# Patient Record
Sex: Female | Born: 1990 | Race: Black or African American | Hispanic: No | Marital: Single | Smoking: Former smoker
Health system: Southern US, Community
[De-identification: ages and names within clinical notes are randomized; demographics above are authoritative.]

## PROBLEM LIST (undated history)

## (undated) ENCOUNTER — Inpatient Hospital Stay (HOSPITAL_COMMUNITY): Payer: Self-pay

## (undated) DIAGNOSIS — A749 Chlamydial infection, unspecified: Secondary | ICD-10-CM

## (undated) DIAGNOSIS — Z8619 Personal history of other infectious and parasitic diseases: Secondary | ICD-10-CM

## (undated) DIAGNOSIS — J45909 Unspecified asthma, uncomplicated: Secondary | ICD-10-CM

## (undated) DIAGNOSIS — F419 Anxiety disorder, unspecified: Secondary | ICD-10-CM

## (undated) DIAGNOSIS — N83209 Unspecified ovarian cyst, unspecified side: Secondary | ICD-10-CM

## (undated) DIAGNOSIS — D573 Sickle-cell trait: Secondary | ICD-10-CM

## (undated) DIAGNOSIS — A609 Anogenital herpesviral infection, unspecified: Secondary | ICD-10-CM

## (undated) HISTORY — DX: Sickle-cell trait: D57.3

## (undated) HISTORY — DX: Personal history of other infectious and parasitic diseases: Z86.19

## (undated) HISTORY — PX: ORTHOPEDIC SURGERY: SHX850

## (undated) HISTORY — DX: Anogenital herpesviral infection, unspecified: A60.9

---
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
## 2008-04-03 ENCOUNTER — Emergency Department (HOSPITAL_COMMUNITY): Admission: EM | Admit: 2008-04-03 | Discharge: 2008-04-03 | Payer: Self-pay | Admitting: *Deleted

## 2008-06-25 ENCOUNTER — Emergency Department (HOSPITAL_COMMUNITY): Admission: EM | Admit: 2008-06-25 | Discharge: 2008-06-25 | Payer: Self-pay | Admitting: Emergency Medicine

## 2009-05-02 ENCOUNTER — Emergency Department (HOSPITAL_COMMUNITY): Admission: EM | Admit: 2009-05-02 | Discharge: 2009-05-02 | Payer: Self-pay | Admitting: Emergency Medicine

## 2009-06-08 ENCOUNTER — Emergency Department (HOSPITAL_COMMUNITY): Admission: EM | Admit: 2009-06-08 | Discharge: 2009-06-08 | Payer: Self-pay | Admitting: Emergency Medicine

## 2009-11-04 ENCOUNTER — Emergency Department (HOSPITAL_COMMUNITY): Admission: EM | Admit: 2009-11-04 | Discharge: 2009-11-04 | Payer: Self-pay | Admitting: Emergency Medicine

## 2009-11-25 ENCOUNTER — Emergency Department (HOSPITAL_COMMUNITY): Admission: EM | Admit: 2009-11-25 | Discharge: 2009-11-25 | Payer: Self-pay | Admitting: Emergency Medicine

## 2010-03-02 ENCOUNTER — Inpatient Hospital Stay (HOSPITAL_COMMUNITY)
Admission: AD | Admit: 2010-03-02 | Discharge: 2010-03-02 | Payer: Self-pay | Source: Home / Self Care | Attending: Obstetrics & Gynecology | Admitting: Obstetrics & Gynecology

## 2010-03-05 LAB — WET PREP, GENITAL
Clue Cells Wet Prep HPF POC: NONE SEEN
Trich, Wet Prep: NONE SEEN

## 2010-03-05 LAB — URINALYSIS, ROUTINE W REFLEX MICROSCOPIC
Protein, ur: NEGATIVE mg/dL
Urine Glucose, Fasting: NEGATIVE mg/dL
pH: 6.5 (ref 5.0–8.0)

## 2010-03-05 LAB — GLUCOSE, CAPILLARY: Glucose-Capillary: 82 mg/dL (ref 70–99)

## 2010-04-09 ENCOUNTER — Emergency Department (HOSPITAL_COMMUNITY)
Admission: EM | Admit: 2010-04-09 | Discharge: 2010-04-09 | Disposition: A | Discharge status: Home / Self Care | Payer: Self-pay | Attending: Emergency Medicine | Admitting: Emergency Medicine

## 2010-04-09 DIAGNOSIS — D573 Sickle-cell trait: Secondary | ICD-10-CM | POA: Insufficient documentation

## 2010-04-09 DIAGNOSIS — N898 Other specified noninflammatory disorders of vagina: Secondary | ICD-10-CM | POA: Insufficient documentation

## 2010-04-09 DIAGNOSIS — R109 Unspecified abdominal pain: Secondary | ICD-10-CM | POA: Insufficient documentation

## 2010-04-09 DIAGNOSIS — B3731 Acute candidiasis of vulva and vagina: Secondary | ICD-10-CM | POA: Insufficient documentation

## 2010-04-09 DIAGNOSIS — E282 Polycystic ovarian syndrome: Secondary | ICD-10-CM | POA: Insufficient documentation

## 2010-04-09 DIAGNOSIS — B373 Candidiasis of vulva and vagina: Secondary | ICD-10-CM | POA: Insufficient documentation

## 2010-04-09 LAB — URINALYSIS, ROUTINE W REFLEX MICROSCOPIC
Bilirubin Urine: NEGATIVE
Ketones, ur: NEGATIVE mg/dL
Nitrite: NEGATIVE
Protein, ur: NEGATIVE mg/dL
pH: 6.5 (ref 5.0–8.0)

## 2010-04-09 LAB — URINE MICROSCOPIC-ADD ON

## 2010-04-09 LAB — WET PREP, GENITAL: Trich, Wet Prep: NONE SEEN

## 2010-04-09 LAB — DIFFERENTIAL
Eosinophils Relative: 3 % (ref 0–5)
Lymphs Abs: 2.7 10*3/uL (ref 0.7–4.0)
Monocytes Absolute: 0.5 10*3/uL (ref 0.1–1.0)
Neutrophils Relative %: 57 % (ref 43–77)

## 2010-04-09 LAB — POCT PREGNANCY, URINE: Preg Test, Ur: NEGATIVE

## 2010-04-09 LAB — CBC
MCH: 31 pg (ref 26.0–34.0)
RBC: 4.74 MIL/uL (ref 3.87–5.11)
WBC: 8.1 10*3/uL (ref 4.0–10.5)

## 2010-04-10 LAB — GC/CHLAMYDIA PROBE AMP, GENITAL
Chlamydia, DNA Probe: NEGATIVE
GC Probe Amp, Genital: NEGATIVE

## 2010-04-26 LAB — COMPREHENSIVE METABOLIC PANEL
AST: 25 U/L (ref 0–37)
CO2: 27 mEq/L (ref 19–32)
Calcium: 9 mg/dL (ref 8.4–10.5)
Creatinine, Ser: 1.01 mg/dL (ref 0.4–1.2)
GFR calc Af Amer: >60 mL/min (ref >60)
GFR calc non Af Amer: >60 mL/min (ref >60)

## 2010-04-26 LAB — CBC
MCH: 31.1 pg (ref 26.0–34.0)
MCHC: 35.5 g/dL (ref 30.0–36.0)
Platelets: 242 10*3/uL (ref 150–400)
RDW: 12.9 % (ref 11.5–15.5)

## 2010-04-26 LAB — URINALYSIS, ROUTINE W REFLEX MICROSCOPIC
Glucose, UA: NEGATIVE mg/dL
Hgb urine dipstick: NEGATIVE
Ketones, ur: NEGATIVE mg/dL
Protein, ur: NEGATIVE mg/dL

## 2010-04-26 LAB — URINE MICROSCOPIC-ADD ON

## 2010-04-26 LAB — DIFFERENTIAL
Eosinophils Relative: 4 % (ref 0–5)
Lymphocytes Relative: 35 % (ref 12–46)
Lymphs Abs: 2.8 10*3/uL (ref 0.7–4.0)

## 2010-04-26 LAB — LIPASE, BLOOD: Lipase: 33 U/L (ref 11–59)

## 2010-05-07 LAB — URINE MICROSCOPIC-ADD ON

## 2010-05-07 LAB — URINALYSIS, ROUTINE W REFLEX MICROSCOPIC
Bilirubin Urine: NEGATIVE
Nitrite: NEGATIVE
Specific Gravity, Urine: 1.02 (ref 1.005–1.030)
pH: 6 (ref 5.0–8.0)

## 2010-05-07 LAB — WET PREP, GENITAL
Clue Cells Wet Prep HPF POC: NONE SEEN
Trich, Wet Prep: NONE SEEN

## 2010-08-29 ENCOUNTER — Encounter (HOSPITAL_COMMUNITY): Payer: Self-pay | Admitting: *Deleted

## 2010-08-29 ENCOUNTER — Inpatient Hospital Stay (HOSPITAL_COMMUNITY): Payer: Medicaid Other

## 2010-08-29 ENCOUNTER — Inpatient Hospital Stay (HOSPITAL_COMMUNITY)
Admission: AD | Admit: 2010-08-29 | Discharge: 2010-08-30 | Disposition: A | Discharge status: Home / Self Care | Payer: Medicaid Other | Source: Ambulatory Visit | Attending: Obstetrics and Gynecology | Admitting: Obstetrics and Gynecology

## 2010-08-29 DIAGNOSIS — R109 Unspecified abdominal pain: Secondary | ICD-10-CM | POA: Insufficient documentation

## 2010-08-29 DIAGNOSIS — O99891 Other specified diseases and conditions complicating pregnancy: Secondary | ICD-10-CM | POA: Insufficient documentation

## 2010-08-29 DIAGNOSIS — Z331 Pregnant state, incidental: Secondary | ICD-10-CM

## 2010-08-29 DIAGNOSIS — O9989 Other specified diseases and conditions complicating pregnancy, childbirth and the puerperium: Secondary | ICD-10-CM

## 2010-08-29 LAB — URINALYSIS, ROUTINE W REFLEX MICROSCOPIC
Leukocytes, UA: NEGATIVE
Protein, ur: NEGATIVE mg/dL
Urobilinogen, UA: 0.2 mg/dL (ref 0.0–1.0)

## 2010-08-29 LAB — POCT PREGNANCY, URINE: Preg Test, Ur: POSITIVE

## 2010-08-29 NOTE — Progress Notes (Signed)
Reports she has a history of cysts on her ovaries, positive preg test at health dept 1 week ago, lower abd pain off/on x 1 week. Some dysuria. No bleeding

## 2010-08-29 NOTE — Progress Notes (Signed)
Pt states she has abd pain 2 or 3 times a day-states has no pain now-is nauseated-no vomiting

## 2010-08-30 ENCOUNTER — Encounter (HOSPITAL_COMMUNITY): Payer: Self-pay | Admitting: Family

## 2010-08-30 NOTE — ED Provider Notes (Signed)
History     Chief Complaint  Patient presents with  . Abdominal Pain   HPI  Patient reports intermittent abdominal pain for the past 3 days. Patient denies vaginal bleeding or abnormal vaginal discharge. Pain is described as suprapubic in nature and denies radiation.  History reviewed. No pertinent past medical history.  History reviewed. No pertinent past surgical history.  No family history on file.  History  Substance Use Topics  . Smoking status: Former Games developer  . Smokeless tobacco: Not on file  . Alcohol Use: No    Allergies: No Known Allergies   (Not in a hospital admission)  Review of Systems  Gastrointestinal: Positive for abdominal pain.  All other systems reviewed and are negative.   Physical Exam   Blood pressure 123/77, pulse 68, temperature 98.9 F (37.2 C), temperature source Oral, resp. rate 20, height 5\' 4"  (1.626 m), weight 84.369 kg (186 lb), last menstrual period 06/26/2010.  Physical Exam  Constitutional: She is oriented to person, place, and time. She appears well-developed and well-nourished. No distress.  HENT:  Head: Normocephalic.  Neck: Normal range of motion. Neck supple.  Cardiovascular: Normal rate, regular rhythm and normal heart sounds.  Exam reveals no gallop and no friction rub.   No murmur heard. Respiratory: Effort normal and breath sounds normal. No respiratory distress.  GI: She exhibits no mass. There is no tenderness. There is no rebound, no guarding and no CVA tenderness.  Genitourinary: Uterus is enlarged. Cervix exhibits no motion tenderness and no discharge. Vaginal discharge (white, creamy) found.  Musculoskeletal: Normal range of motion.  Neurological: She is alert and oriented to person, place, and time.  Skin: Skin is warm and dry.  Psychiatric: She has a normal mood and affect.    MAU Course  Procedures Wet prep-negative GC/CT-pending [place] - viable 7.2 wk IUP UA - negative   Assessment and Plan  Viable  Intrauterine pregnancy  Plan:  Begin prenatal care as soon as possible.  [facility] 08/30/2010, 12:05 AM

## 2010-08-31 LAB — GC/CHLAMYDIA PROBE AMP, GENITAL
Chlamydia, DNA Probe: NEGATIVE
GC Probe Amp, Genital: NEGATIVE

## 2010-10-10 ENCOUNTER — Emergency Department (HOSPITAL_COMMUNITY)
Admission: EM | Admit: 2010-10-10 | Discharge: 2010-10-10 | Discharge status: Against Medical Advice | Payer: Medicaid Other | Attending: Emergency Medicine | Admitting: Emergency Medicine

## 2010-10-10 DIAGNOSIS — N898 Other specified noninflammatory disorders of vagina: Secondary | ICD-10-CM | POA: Insufficient documentation

## 2010-11-07 ENCOUNTER — Other Ambulatory Visit: Payer: Self-pay | Admitting: Family Medicine

## 2010-11-07 DIAGNOSIS — Z3689 Encounter for other specified antenatal screening: Secondary | ICD-10-CM

## 2010-11-07 LAB — ANTIBODY SCREEN: Antibody Screen: NEGATIVE

## 2010-11-07 LAB — RUBELLA ANTIBODY, IGM: Rubella: IMMUNE

## 2010-11-07 LAB — RPR: RPR: NONREACTIVE

## 2010-11-07 LAB — ABO/RH

## 2010-11-08 ENCOUNTER — Other Ambulatory Visit: Payer: Self-pay

## 2010-11-21 ENCOUNTER — Ambulatory Visit (HOSPITAL_COMMUNITY)
Admission: RE | Admit: 2010-11-21 | Discharge: 2010-11-21 | Disposition: A | Discharge status: Home / Self Care | Payer: Medicaid Other | Source: Ambulatory Visit | Attending: Family Medicine | Admitting: Family Medicine

## 2010-11-21 DIAGNOSIS — O358XX0 Maternal care for other (suspected) fetal abnormality and damage, not applicable or unspecified: Secondary | ICD-10-CM | POA: Insufficient documentation

## 2010-11-21 DIAGNOSIS — Z3689 Encounter for other specified antenatal screening: Secondary | ICD-10-CM

## 2010-11-21 DIAGNOSIS — Z363 Encounter for antenatal screening for malformations: Secondary | ICD-10-CM | POA: Insufficient documentation

## 2010-11-21 DIAGNOSIS — Z1389 Encounter for screening for other disorder: Secondary | ICD-10-CM | POA: Insufficient documentation

## 2010-12-07 ENCOUNTER — Other Ambulatory Visit: Payer: Self-pay | Admitting: Family Medicine

## 2010-12-11 ENCOUNTER — Ambulatory Visit (HOSPITAL_COMMUNITY): Payer: Medicaid Other

## 2010-12-12 ENCOUNTER — Ambulatory Visit (HOSPITAL_COMMUNITY)
Admission: RE | Admit: 2010-12-12 | Discharge: 2010-12-12 | Disposition: A | Discharge status: Home / Self Care | Payer: Medicaid Other | Source: Ambulatory Visit | Attending: Family Medicine | Admitting: Family Medicine

## 2010-12-12 ENCOUNTER — Encounter (HOSPITAL_COMMUNITY): Payer: Self-pay

## 2010-12-12 NOTE — Progress Notes (Signed)
Genetic Counseling  High-Risk Gestation Note  Appointment Date:  12/12/2010 Referred By: Reva Bores, MD Date of Birth:  Jun 09, 1990 Partner: Brynda Rim    Pregnancy History: G1P0000 Estimated Date of Delivery: 04/15/11 Estimated Gestational Age: [redacted]w[redacted]d  I met with Ms. Siriah Pollina for genetic counseling regarding her sickle cell anemia screening results. In addition, we reviewed Ms. Teti's MSAFP results and the associated significance.    Ms. Gentzler was offered testing for sickle cell anemia (SCA) because this condition occurs more commonly among individuals with African-American ancestry.  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    We discussed that SCA affects the shape and function of the red blood cell by producing abnormal hemoglobin.  They were counseled that hemoglobin is a protein in the RBCs that carries oxygen to the body's organs.  Individuals who have SCA have a changes within the genes that codes for hemoglobin.  Ms. Hedger was counseled that SCA is inherited in an autosomal recessive manner, and occurs when both copies of the hemoglobin gene are changed and produce an abnormal hemoglobin S.  Typically, one abnormal gene for the production of hemoglobin S is inherited from each parent.  A carrier of SCA has one altered copy of the gene for hemoglobin and one typical working copy.  Carriers of recessive conditions typically do not have symptoms related to the condition because they still have one functioning copy of the gene, and thus some production of the typical protein coded for by that gene.  Given the recessive inheritance, we discussed the importance of understanding the FOB's carrier status in order to accurately predict the risk of a hemoglobinopathy in the fetus.  Ms. Daise reported that her partner has had testing for SCA in the past and that he does NOT have SCT. Medical records were not available to  verify the reported information.  We discussed that other hemoglobin variants (hemoglobin C), when combined with hemoglobin S, can cause a medically significant hemoglobinopathy.  We discussed the availability of Mr. Wall having repeat testing (hemoglobin electrophoresis) to determine whether he has any hemoglobin variant, including SCT.  He is currently incarcerated and not available for testing.  Ms. Maruyama expressed that she will discuss this option with him upon his release in December. She understands that an accurate risk assessment cannot be performed without further information.  However, assuming that Mr. Wall does not have SCT or any other hemoglobin variant, the fetus would not be expected to have an increased risk for a hemoglobinopathy.  We also reviewed the availability of newborn screening in [place] for hemoglobinopathies.    Both family histories were reviewed and found to be contributory for Ms. Hemstreet having a maternal great aunt who has SCA as well as several relatives who have SCT.  There is no other history of genetic conditions, mental retardation, or birth defects.  Without further information regarding the provided family history, an accurate genetic risk cannot be calculated.   Further genetic counseling is warranted if more information is obtained.  Additionally, we reviewed Ms. Lilja's maternal serum AFP result and the borderline elevation of this protein.  She was counseled that AFP MoM values between 2.0 and 2.49 are considered to be borderline, or at the high end of the normal range.  Although technically within normal limits, literature suggests that of those pregnancies with a NTD that are not detected using a screening cutoff of 2.5 MoMs, the majority  fall within the above state borderline range.  For this reason, a detailed ultrasound is offered for all patients who have a borderline elevated MSAFP.  We also reviewed Ms. Topp's normal fetal anatomy and  the associated reduction in risks for a fetal NTD.  I counseled the patient for approximately 42 minutes regarding the above risks and available options.     Donald Prose, MS Certified Genetic Counselor

## 2011-02-12 NOTE — L&D Delivery Note (Signed)
Delivery Note At 10:59 PM a viable female was delivered via Vaginal, Spontaneous Delivery (Presentation: Right Occiput Anterior).  APGAR: 7, 9; weight 8 lb 11 oz (3941 g).   Placenta status: Intact, Spontaneous.  Cord: 3 vessels with the following complications: 1 minute shoulder dystocia resolved with mcroberts and woods maneuver.  Anesthesia: Epidural  Episiotomy: None Lacerations: 1st degree;Perineal Suture Repair: 3.0 vicryl rapide Est. Blood Loss (mL): 300 mL  Mom to postpartum.  Baby to nursery-stable.  [name] 04/18/2011, 11:45 PM

## 2011-03-11 ENCOUNTER — Encounter (HOSPITAL_COMMUNITY): Payer: Self-pay | Admitting: *Deleted

## 2011-03-11 ENCOUNTER — Inpatient Hospital Stay (HOSPITAL_COMMUNITY)
Admission: AD | Admit: 2011-03-11 | Discharge: 2011-03-12 | Disposition: A | Discharge status: Home / Self Care | Payer: Medicaid Other | Source: Ambulatory Visit | Attending: Obstetrics & Gynecology | Admitting: Obstetrics & Gynecology

## 2011-03-11 DIAGNOSIS — O99891 Other specified diseases and conditions complicating pregnancy: Secondary | ICD-10-CM | POA: Insufficient documentation

## 2011-03-11 DIAGNOSIS — R6889 Other general symptoms and signs: Secondary | ICD-10-CM

## 2011-03-11 DIAGNOSIS — R509 Fever, unspecified: Secondary | ICD-10-CM | POA: Insufficient documentation

## 2011-03-11 DIAGNOSIS — J3489 Other specified disorders of nose and nasal sinuses: Secondary | ICD-10-CM | POA: Insufficient documentation

## 2011-03-11 DIAGNOSIS — O4700 False labor before 37 completed weeks of gestation, unspecified trimester: Secondary | ICD-10-CM | POA: Insufficient documentation

## 2011-03-11 HISTORY — DX: Anxiety disorder, unspecified: F41.9

## 2011-03-11 HISTORY — DX: Unspecified ovarian cyst, unspecified side: N83.209

## 2011-03-11 LAB — URINALYSIS, ROUTINE W REFLEX MICROSCOPIC
Ketones, ur: NEGATIVE mg/dL
Leukocytes, UA: NEGATIVE
Nitrite: NEGATIVE
Specific Gravity, Urine: <1.005 — ABNORMAL LOW (ref 1.005–1.030)
Urobilinogen, UA: 0.2 mg/dL (ref 0.0–1.0)
pH: 6.5 (ref 5.0–8.0)

## 2011-03-11 MED ORDER — ACETAMINOPHEN 500 MG PO TABS
1000.0000 mg | ORAL_TABLET | Freq: Once | ORAL | Status: AC
  Administered 2011-03-11: 1000 mg via ORAL
  Filled 2011-03-11: qty 2

## 2011-03-11 NOTE — Progress Notes (Signed)
PT SAYS    SHE WAS UPSET  SINCE 9AM.   SAYS HAS COLD, BONES ACHE, FEELS LIKE BABY IS PRESSING DOWN. THINKS SHE GOT FLU LIKE S/S BECAUSE UPSET.  FEELS LIKE EYES BURN.   HAS VOMITED X2 TODAY.  NO DIARRHEA.  NO SORE THROAT,   HAS NOT CHECKED TEMP AT HOME.  GETS [facility] AT [facility].

## 2011-03-11 NOTE — Progress Notes (Signed)
NO COUGHING

## 2011-03-11 NOTE — Progress Notes (Signed)
C/o feeling bad with 2 bouts of vomiting, fever and aching bone for 6-7 hours; FOB was sick 3 days ago with different symptoms;

## 2011-03-12 DIAGNOSIS — J3489 Other specified disorders of nose and nasal sinuses: Secondary | ICD-10-CM

## 2011-03-12 DIAGNOSIS — O99891 Other specified diseases and conditions complicating pregnancy: Secondary | ICD-10-CM

## 2011-03-12 DIAGNOSIS — O4700 False labor before 37 completed weeks of gestation, unspecified trimester: Secondary | ICD-10-CM

## 2011-03-12 DIAGNOSIS — R509 Fever, unspecified: Secondary | ICD-10-CM

## 2011-03-12 MED ORDER — OSELTAMIVIR PHOSPHATE 75 MG PO CAPS
75.0000 mg | ORAL_CAPSULE | Freq: Once | ORAL | Status: AC
  Administered 2011-03-12: 75 mg via ORAL
  Filled 2011-03-12: qty 1

## 2011-03-12 MED ORDER — ONDANSETRON HCL 4 MG/2ML IJ SOLN
4.0000 mg | Freq: Once | INTRAMUSCULAR | Status: AC
  Administered 2011-03-12: 4 mg via INTRAVENOUS
  Filled 2011-03-12: qty 2

## 2011-03-12 MED ORDER — LACTATED RINGERS IV BOLUS (SEPSIS)
1000.0000 mL | Freq: Once | INTRAVENOUS | Status: AC
  Administered 2011-03-12: 1000 mL via INTRAVENOUS

## 2011-03-12 MED ORDER — OSELTAMIVIR PHOSPHATE 75 MG PO CAPS: 75.0000 mg | ORAL_CAPSULE | Freq: Once | ORAL | Status: AC

## 2011-03-12 MED ORDER — OSELTAMIVIR PHOSPHATE 75 MG PO CAPS: 75.0000 mg | ORAL_CAPSULE | Freq: Two times a day (BID) | ORAL | Status: AC

## 2011-03-12 NOTE — ED Provider Notes (Signed)
History   Brenda Gates is a 21 y.o. G1P0000 at [redacted]w[redacted]d presenting with report of generalized body aches, fever/chills, head congestion, and n/v x 2 that began 1/28 am.  Occasional uc's/pelvic pressure.  When asked about LOF, states she urinated on herself earlier.  Received flu shot this year, hasn't been around anyone w/ flu she's aware of.  Reports +fm.  Denies VB. Receives [facility].  Next visit 'next week'.   No chief complaint on file.  HPI  OB History    Grav Para Term Preterm Abortions TAB SAB Ect Mult Living   1 0 0 0 0 0 0 0 0 0       Past Medical History  Diagnosis Date   Ovarian cyst    Anxiety     Past Surgical History  Procedure Date   Orthopedic surgery     No family history on file.  History  Substance Use Topics   Smoking status: Former Smoker   Smokeless tobacco: Not on file   Alcohol Use: No    Allergies: No Known Allergies  Prescriptions prior to admission  Medication Sig Dispense Refill   Prenatal Vit-Fe Fumarate-FA (PRENATAL MULTIVITAMIN) TABS Take 1 tablet by mouth daily.        Review of Systems  Constitutional: Positive for fever and chills.  HENT: Positive for congestion (head) and sore throat.   Eyes: Negative.   Respiratory: Negative.  Negative for cough, sputum production, shortness of breath and wheezing.   Cardiovascular: Negative.   Gastrointestinal: Positive for nausea, vomiting (x 2 today) and abdominal pain (occasional uc's, lower pelvic pressuer). Negative for diarrhea.  Genitourinary: Negative.   Musculoskeletal: Positive for back pain (bilateral lower back pain).       Generalized body aches   Skin: Negative.   Neurological: Negative.   Endo/Heme/Allergies: Negative.   Psychiatric/Behavioral: Negative.    Physical Exam   Blood pressure 122/72, pulse 122, temperature 101.4 F (38.6 C), temperature source Oral, resp. rate 16, height 5\' 6"  (1.676 m), weight 90.719 kg (200 lb), last menstrual period 06/26/2010,  unknown if currently breastfeeding.  Physical Exam  Constitutional: She is oriented to person, place, and time. She appears well-developed and well-nourished.  HENT:  Head: Normocephalic.       Moist mucous membranes  Eyes: Pupils are equal, round, and reactive to light.  Neck: Normal range of motion.  Cardiovascular: Regular rhythm.        tachycardic  Respiratory: Effort normal and breath sounds normal. No respiratory distress. She has no wheezes. She has no rales.  GI: Soft.       Gravid  Genitourinary: Vagina normal and uterus normal. No vaginal discharge found.  Musculoskeletal: Normal range of motion. She exhibits no edema.  Neurological: She is alert and oriented to person, place, and time. She has normal reflexes.  Skin: Skin is warm and dry.  Psychiatric: She has a normal mood and affect. Her behavior is normal. Judgment and thought content normal.   FHR: Prior to maternal APAP: 170's/180's with moderate variability, 15x15 accels, no decels (tracing maternal in many areas of strip)           After maternal APAP: 145, moderate variability, 15x15 accels, no decels UCs: Irregular mild uc's with ui SVE: loose 1/50/-2, vtx, posterior  MAU Course  Procedures  EFM UA- wnl SVE Amnisure- neg IVF bolus- feels 'better' after fluids Zofran- nausea resolved Tylenol- temp down from 101.7 to 98.5 Tamiflu 75mg  po x 1  Assessment and  Plan  A: IUP @ 35.0wks     Influenza-like symptoms     FHR: Cat I     Stable maternal/fetal unit     Preterm uc's- no tocolytics given  P: D/C home     Rx Tamiflu 75mg  po BID x 5d (received 1st dose in MAU)     APAP 650mg  q 4hr x 2d      OTC Cepacol, Robitussin, Mucinex for symptoms     Keep appointment at [facility] next week as scheduled     Return to MAU is s/s aren't improving after 5 days, labor, srom, vb, decreased fm   POC discussed w/ Dorathy Kinsman, CNM and Dr. Pasty Arch, SNM 03/12/2011, 12:33 AM

## 2011-03-24 ENCOUNTER — Inpatient Hospital Stay (HOSPITAL_COMMUNITY)
Admission: AD | Admit: 2011-03-24 | Discharge: 2011-03-24 | Disposition: A | Discharge status: Home / Self Care | Payer: Medicaid Other | Source: Ambulatory Visit | Attending: Obstetrics and Gynecology | Admitting: Obstetrics and Gynecology

## 2011-03-24 ENCOUNTER — Encounter (HOSPITAL_COMMUNITY): Payer: Self-pay | Admitting: *Deleted

## 2011-03-24 DIAGNOSIS — Y999 Unspecified external cause status: Secondary | ICD-10-CM | POA: Insufficient documentation

## 2011-03-24 DIAGNOSIS — O99891 Other specified diseases and conditions complicating pregnancy: Secondary | ICD-10-CM | POA: Insufficient documentation

## 2011-03-24 DIAGNOSIS — N949 Unspecified condition associated with female genital organs and menstrual cycle: Secondary | ICD-10-CM | POA: Insufficient documentation

## 2011-03-24 DIAGNOSIS — W19XXXA Unspecified fall, initial encounter: Secondary | ICD-10-CM

## 2011-03-24 DIAGNOSIS — W010XXA Fall on same level from slipping, tripping and stumbling without subsequent striking against object, initial encounter: Secondary | ICD-10-CM | POA: Insufficient documentation

## 2011-03-24 DIAGNOSIS — Y92009 Unspecified place in unspecified non-institutional (private) residence as the place of occurrence of the external cause: Secondary | ICD-10-CM | POA: Insufficient documentation

## 2011-03-24 MED ORDER — OXYCODONE-ACETAMINOPHEN 5-325 MG PO TABS
1.0000 | ORAL_TABLET | Freq: Once | ORAL | Status: AC
  Administered 2011-03-24: 1 via ORAL
  Filled 2011-03-24: qty 1

## 2011-03-24 MED ORDER — OXYCODONE-ACETAMINOPHEN 5-325 MG PO TABS: 1.0000 | ORAL_TABLET | Freq: Once | ORAL | Status: AC

## 2011-03-24 NOTE — ED Provider Notes (Signed)
History     Chief Complaint  Patient presents with   Fall   HPI G1 @ 36 wks in states was cleaning house tripped and fell, hit abd slightly but states hurts in pubic area and into vagina.  OB History    Grav Para Term Preterm Abortions TAB SAB Ect Mult Living   1 0 0 0 0 0 0 0 0 0       Past Medical History  Diagnosis Date   Ovarian cyst    Anxiety     Past Surgical History  Procedure Date   Orthopedic surgery     shoulder dislocation    No family history on file.  History  Substance Use Topics   Smoking status: Former Smoker    Quit date: 08/06/2010   Smokeless tobacco: Not on file   Alcohol Use: No    Allergies: No Known Allergies  Prescriptions prior to admission  Medication Sig Dispense Refill   Prenatal Vit-Fe Fumarate-FA (PRENATAL MULTIVITAMIN) TABS Take 1 tablet by mouth daily.        Review of Systems  Constitutional: Negative.   HENT: Negative.   Eyes: Negative.   Respiratory: Negative.   Cardiovascular: Negative.   Gastrointestinal: Negative.   Genitourinary: Negative.   Musculoskeletal: Positive for falls.  Skin: Negative.   Neurological: Negative.   Endo/Heme/Allergies: Negative.   Psychiatric/Behavioral: Negative.    Physical Exam   Blood pressure 116/65, pulse 101, temperature 98 F (36.7 C), temperature source Oral, resp. rate 20, last menstrual period 06/26/2010.  Physical Exam  Constitutional: She is oriented to person, place, and time. She appears well-developed and well-nourished.  HENT:  Head: Normocephalic.  Neck: Normal range of motion.  Cardiovascular: Normal rate, regular rhythm, normal heart sounds and intact distal pulses.   Respiratory: Effort normal and breath sounds normal.  GI: Soft.       Tender over pubic area.  Musculoskeletal: Normal range of motion.  Neurological: She is alert and oriented to person, place, and time. She has normal reflexes.  Skin: Skin is warm and dry.  Psychiatric: She has a normal  mood and affect. Her behavior is normal. Judgment and thought content normal.    MAU Course  Procedures  MDM Stable maternal-fetal unit  Assessment and Plan  Stable maternal-fetal unit @ 36 wks S/P fall. Manage pain, monitor x4 hrs.  LAWSON,MARIE DARLENE 03/24/2011, 8:39 PM   Addendum 03/24/11 @ 2315: Cat I FHR x 4hrs, occasional mild uc's w/ mild UI, reports +fm. Reviewed POC w/ Zerita Boers, CNM- states ok to d/c pt home.    A: IUP @ 36.6     S/P slipping and falling hitting Lt side of abdomen on floor- pelvic pain w/o VB or LOF     FHR Cat I x 4hrs     Stable maternal/fetal unit  P: D/C home     Rx Percocet 5/325mg  po q 4hrs prn pain      Keep appt at [facility] on 2/12 as scheduled     Return to MAU for labor, srom, vb, decreased fm, or other concerns  Joellyn Haff, SNM

## 2011-03-24 NOTE — Progress Notes (Signed)
Provider at bedside elevating pt.

## 2011-03-24 NOTE — Progress Notes (Signed)
Pt reports she was cleaning and slip backwards and fell left on side and stomach . Has very back pelvic pain and difficulty walking reports good fetal movement.

## 2011-03-24 NOTE — Progress Notes (Signed)
Pt voiced concerns about staying to be monitored for 4 hours.Pt not sure her ride would still be available. Pt informed that if she left prior to the 4 hours she would be leaving AMA. Pt also informed that would not be beneficial to her fetal well being.

## 2011-03-27 NOTE — ED Provider Notes (Signed)
Attestation of Attending Supervision of Advanced Practitioner: Evaluation and management procedures were performed by the PA/NP/CNM/OB Fellow under my supervision/collaboration. Chart reviewed and agree with management and plan.  Meghna Hagmann V 03/27/2011 4:06 PM    

## 2011-04-17 ENCOUNTER — Encounter (HOSPITAL_COMMUNITY): Payer: Self-pay | Admitting: *Deleted

## 2011-04-17 ENCOUNTER — Inpatient Hospital Stay (HOSPITAL_COMMUNITY)
Admission: AD | Admit: 2011-04-17 | Discharge: 2011-04-17 | Disposition: A | Discharge status: Home / Self Care | Payer: Medicaid Other | Source: Ambulatory Visit | Attending: Obstetrics & Gynecology | Admitting: Obstetrics & Gynecology

## 2011-04-17 DIAGNOSIS — O479 False labor, unspecified: Secondary | ICD-10-CM | POA: Insufficient documentation

## 2011-04-17 DIAGNOSIS — O99891 Other specified diseases and conditions complicating pregnancy: Secondary | ICD-10-CM | POA: Insufficient documentation

## 2011-04-17 HISTORY — DX: Chlamydial infection, unspecified: A74.9

## 2011-04-17 NOTE — ED Provider Notes (Signed)
Speculum exam done to R/O ROM.  Hx of leaking into the floor - needed to be "mopped up".  Thick tan mucus seen coming from cervix.  No pooling with valsalva.  Amnisure done.    Nolene Bernheim, NP 04/17/11 [phone]

## 2011-04-17 NOTE — Discharge Instructions (Signed)

## 2011-04-17 NOTE — Progress Notes (Signed)
States ctx's noted since got up, hasn't been timing them "they just come", states are mild, were a lot worse last night.. Noted some wetness when got here, had a glob of mucous come out, was blood streaked, some watery mixed in.

## 2011-04-18 ENCOUNTER — Inpatient Hospital Stay (HOSPITAL_COMMUNITY): Payer: Medicaid Other | Admitting: Anesthesiology

## 2011-04-18 ENCOUNTER — Inpatient Hospital Stay (HOSPITAL_COMMUNITY)
Admission: AD | Admit: 2011-04-18 | Discharge: 2011-04-20 | DRG: 775 | Disposition: A | Discharge status: Home / Self Care | Payer: Medicaid Other | Source: Ambulatory Visit | Attending: Obstetrics & Gynecology | Admitting: Obstetrics & Gynecology

## 2011-04-18 ENCOUNTER — Encounter (HOSPITAL_COMMUNITY): Payer: Self-pay | Admitting: *Deleted

## 2011-04-18 ENCOUNTER — Encounter (HOSPITAL_COMMUNITY): Payer: Self-pay | Admitting: Anesthesiology

## 2011-04-18 DIAGNOSIS — O99892 Other specified diseases and conditions complicating childbirth: Secondary | ICD-10-CM | POA: Diagnosis present

## 2011-04-18 DIAGNOSIS — O9989 Other specified diseases and conditions complicating pregnancy, childbirth and the puerperium: Secondary | ICD-10-CM

## 2011-04-18 DIAGNOSIS — IMO0001 Reserved for inherently not codable concepts without codable children: Secondary | ICD-10-CM

## 2011-04-18 DIAGNOSIS — Z22330 Carrier of Group B streptococcus: Secondary | ICD-10-CM

## 2011-04-18 LAB — CBC
MCV: 85.4 fL (ref 78.0–100.0)
Platelets: 206 10*3/uL (ref 150–400)
RBC: 4.32 MIL/uL (ref 3.87–5.11)
RDW: 14.4 % (ref 11.5–15.5)
WBC: 12.2 10*3/uL — ABNORMAL HIGH (ref 4.0–10.5)

## 2011-04-18 LAB — RPR: RPR Ser Ql: NONREACTIVE

## 2011-04-18 MED ORDER — LACTATED RINGERS IV SOLN
500.0000 mL | INTRAVENOUS | Status: DC | PRN
  Administered 2011-04-18: 800 mL via INTRAVENOUS

## 2011-04-18 MED ORDER — LACTATED RINGERS IV SOLN
INTRAVENOUS | Status: DC
  Administered 2011-04-18 (×2): via INTRAVENOUS

## 2011-04-18 MED ORDER — LIDOCAINE HCL (PF) 1 % IJ SOLN
30.0000 mL | INTRAMUSCULAR | Status: DC | PRN
  Filled 2011-04-18: qty 30

## 2011-04-18 MED ORDER — OXYTOCIN 20 UNITS IN LACTATED RINGERS INFUSION - SIMPLE: 125.0000 mL/h | Freq: Once | INTRAVENOUS | Status: DC

## 2011-04-18 MED ORDER — FENTANYL 2.5 MCG/ML BUPIVACAINE 1/10 % EPIDURAL INFUSION (WH - ANES)
14.0000 mL/h | INTRAMUSCULAR | Status: DC
  Administered 2011-04-18 (×2): 14 mL/h via EPIDURAL
  Filled 2011-04-18 (×2): qty 60

## 2011-04-18 MED ORDER — LIDOCAINE HCL (PF) 1 % IJ SOLN
INTRAMUSCULAR | Status: DC | PRN
  Administered 2011-04-18 (×2): 5 mL

## 2011-04-18 MED ORDER — CITRIC ACID-SODIUM CITRATE 334-500 MG/5ML PO SOLN: 30.0000 mL | ORAL | Status: DC | PRN

## 2011-04-18 MED ORDER — PHENYLEPHRINE 40 MCG/ML (10ML) SYRINGE FOR IV PUSH (FOR BLOOD PRESSURE SUPPORT): 80.0000 ug | PREFILLED_SYRINGE | INTRAVENOUS | Status: DC | PRN

## 2011-04-18 MED ORDER — ONDANSETRON HCL 4 MG/2ML IJ SOLN: 4.0000 mg | Freq: Four times a day (QID) | INTRAMUSCULAR | Status: DC | PRN

## 2011-04-18 MED ORDER — LACTATED RINGERS IV SOLN: INTRAVENOUS | Status: DC

## 2011-04-18 MED ORDER — PHENYLEPHRINE 40 MCG/ML (10ML) SYRINGE FOR IV PUSH (FOR BLOOD PRESSURE SUPPORT)
80.0000 ug | PREFILLED_SYRINGE | INTRAVENOUS | Status: DC | PRN
  Filled 2011-04-18: qty 5

## 2011-04-18 MED ORDER — OXYTOCIN BOLUS FROM INFUSION
500.0000 mL | Freq: Once | INTRAVENOUS | Status: DC
  Administered 2011-04-18: 500 mL via INTRAVENOUS
  Filled 2011-04-18: qty 500

## 2011-04-18 MED ORDER — EPHEDRINE 5 MG/ML INJ
10.0000 mg | INTRAVENOUS | Status: DC | PRN
  Filled 2011-04-18: qty 4

## 2011-04-18 MED ORDER — TERBUTALINE SULFATE 1 MG/ML IJ SOLN: 0.2500 mg | Freq: Once | INTRAMUSCULAR | Status: AC | PRN

## 2011-04-18 MED ORDER — EPHEDRINE 5 MG/ML INJ: 10.0000 mg | INTRAVENOUS | Status: DC | PRN

## 2011-04-18 MED ORDER — OXYCODONE-ACETAMINOPHEN 5-325 MG PO TABS: 1.0000 | ORAL_TABLET | ORAL | Status: DC | PRN

## 2011-04-18 MED ORDER — DIPHENHYDRAMINE HCL 50 MG/ML IJ SOLN: 12.5000 mg | INTRAMUSCULAR | Status: DC | PRN

## 2011-04-18 MED ORDER — ACETAMINOPHEN 325 MG PO TABS: 650.0000 mg | ORAL_TABLET | ORAL | Status: DC | PRN

## 2011-04-18 MED ORDER — PENICILLIN G POTASSIUM 5000000 UNITS IJ SOLR
5.0000 10*6.[IU] | Freq: Once | INTRAVENOUS | Status: AC
  Administered 2011-04-18: 5 10*6.[IU] via INTRAVENOUS
  Filled 2011-04-18: qty 5

## 2011-04-18 MED ORDER — LACTATED RINGERS IV SOLN: 500.0000 mL | Freq: Once | INTRAVENOUS | Status: DC

## 2011-04-18 MED ORDER — FLEET ENEMA 7-19 GM/118ML RE ENEM: 1.0000 | ENEMA | RECTAL | Status: DC | PRN

## 2011-04-18 MED ORDER — PENICILLIN G POTASSIUM 5000000 UNITS IJ SOLR
2.5000 10*6.[IU] | INTRAVENOUS | Status: DC
  Administered 2011-04-18: 2.5 10*6.[IU] via INTRAVENOUS
  Filled 2011-04-18 (×6): qty 2.5

## 2011-04-18 MED ORDER — IBUPROFEN 600 MG PO TABS: 600.0000 mg | ORAL_TABLET | Freq: Four times a day (QID) | ORAL | Status: DC | PRN

## 2011-04-18 MED ORDER — OXYTOCIN 20 UNITS IN LACTATED RINGERS INFUSION - SIMPLE
1.0000 m[IU]/min | INTRAVENOUS | Status: DC
  Administered 2011-04-18: 2 m[IU]/min via INTRAVENOUS
  Filled 2011-04-18: qty 1000

## 2011-04-18 NOTE — Anesthesia Procedure Notes (Signed)
Epidural Patient location during procedure: OB Start time: 04/18/2011 3:07 PM  Staffing Anesthesiologist: Brayton Caves R Performed by: anesthesiologist   Preanesthetic Checklist Completed: patient identified, site marked, surgical consent, pre-op evaluation, timeout performed, IV checked, risks and benefits discussed and monitors and equipment checked  Epidural Patient position: sitting Prep: site prepped and draped and DuraPrep Patient monitoring: continuous pulse ox and blood pressure Approach: midline Injection technique: LOR air and LOR saline  Needle:  Needle type: Tuohy  Needle gauge: 17 G Needle length: 9 cm Needle insertion depth: 6 cm Catheter type: closed end flexible Catheter size: 19 Gauge Catheter at skin depth: 11 cm Test dose: negative  Assessment Events: blood not aspirated, injection not painful, no injection resistance, negative IV test and no paresthesia  Additional Notes Patient identified.  Risk benefits discussed including failed block, incomplete pain control, headache, nerve damage, paralysis, blood pressure changes, nausea, vomiting, reactions to medication both toxic or allergic, and postpartum back pain.  Patient expressed understanding and wished to proceed.  All questions were answered.  Sterile technique used throughout procedure and epidural site dressed with sterile barrier dressing. No paresthesia or other complications noted.The patient did not experience any signs of intravascular injection such as tinnitus or metallic taste in mouth nor signs of intrathecal spread such as rapid motor block. Please see nursing notes for vital signs.

## 2011-04-18 NOTE — Anesthesia Preprocedure Evaluation (Signed)

## 2011-04-18 NOTE — H&P (Signed)
Brenda Gates is a 21 y.o. female presenting for labor check. Maternal Medical History:  Reason for admission: Reason for admission: contractions and vaginal bleeding.  Reason for Admission:   nauseaBloody show  Contractions: Onset was 6-12 hours ago.   Frequency: regular.   Perceived severity is moderate.    Fetal activity: Perceived fetal activity is normal.   Last perceived fetal movement was within the past hour.    Prenatal complications: Obesity Smoking in early pregnnacy HGB AS  Prenatal Complications - Diabetes: none.    OB History    Grav Para Term Preterm Abortions TAB SAB Ect Mult Living   1 0 0 0 0 0 0 0 0 0      Past Medical History  Diagnosis Date  . Ovarian cyst   . Anxiety   . Chlamydia    Past Surgical History  Procedure Date  . Orthopedic surgery     shoulder dislocation   Family History: family history includes Diabetes in her brother, maternal aunt, and paternal aunt and Hypertension in her maternal grandfather and mother.  There is no history of Anesthesia problems. Social History:  reports that she quit smoking about 8 months ago. She does not have any smokeless tobacco history on file. She reports that she does not drink alcohol or use illicit drugs.  Review of Systems  Constitutional: Negative for fever and chills.  Eyes: Negative for blurred vision, double vision and photophobia.  Respiratory: Negative for cough.   Cardiovascular: Positive for leg swelling. Negative for chest pain.  Gastrointestinal: Positive for abdominal pain. Negative for nausea and vomiting.  Genitourinary: Negative for dysuria, urgency and frequency.  Skin: Negative for rash.  Neurological: Negative for dizziness and headaches.  Psychiatric/Behavioral: Negative for depression.    Dilation: 3.5 Effacement (%): 100 Station: -2 Exam by:: Joana Reamer RN Blood pressure 131/91, pulse 91, resp. rate 22, height 5\' 6"  (1.676 m), weight 89.812 kg (198 lb), last menstrual  period 06/26/2010. Maternal Exam:  Uterine Assessment: Contraction strength is moderate.  Contraction frequency is irregular.   Abdomen: Fetal presentation: vertex  Introitus: Normal vulva. Normal vagina.  Ferning test: not done.  Nitrazine test: not done. Amniotic fluid character: not assessed.  Cervix: Cervix evaluated by digital exam.   Cx per Huntley Dec RN at 1321: 4-5/100/-2 vtx, bulging, slight show  Fetal Exam Fetal Monitor Review: Mode: ultrasound.   Baseline rate: 135-140.  Variability: moderate (6-25 bpm).   Pattern: accelerations present and no decelerations.    Fetal State Assessment: Category I - tracings are normal.     Physical Exam  Constitutional: She is oriented to person, place, and time. She appears well-developed and well-nourished. She appears distressed.  HENT:  Head: Normocephalic.  Eyes: Pupils are equal, round, and reactive to light.  Neck: Normal range of motion. Neck supple. No thyromegaly present.  Cardiovascular: Normal rate, regular rhythm and normal heart sounds.   Respiratory: Effort normal and breath sounds normal.  GI: Soft. Bowel sounds are normal. There is no tenderness.  Genitourinary: Vagina normal and uterus normal.  Musculoskeletal: Normal range of motion.  Neurological: She is alert and oriented to person, place, and time. She has normal reflexes.  Skin: Skin is warm and dry.  Psychiatric: She has a normal mood and affect.    Prenatal labs: ABO, Rh: B/Positive/-- (09/26 0000) Antibody: Negative (09/26 0000) Rubella: Immune (09/26 0000) RPR: Nonreactive (09/26 0000)  HBsAg: Negative (09/26 0000)  HIV: Non-reactive (09/26 0000)  GBS: Positive (02/05 0000)  Assessment/Plan: G1 at [redacted]w[redacted]d by 7wk [place] in early active labor Category 1 FHR GBS carriage -> admit, IV PCN, expectant management   Nickalas Mccarrick 04/18/2011, 2:20 PM

## 2011-04-18 NOTE — Progress Notes (Signed)
Subjective: Patient comfortable.  Objective: BP 120/90  Pulse 82  Temp(Src) 98.4 F (36.9 C) (Oral)  Resp 18  Ht 5\' 6"  (1.676 m)  Wt 89.812 kg (198 lb)  BMI 31.96 kg/m2  LMP 06/26/2010      FHT:  FHR: 140s bpm, variability: moderate,  accelerations:  Present,  decelerations:  Absent UC:   regular, every 2-5 minutes SVE:   Dilation: 3.5 Effacement (%): 100 Station: -1 Exam by:: dr. Adrian Blackwater  Labs: Lab Results  Component Value Date   WBC 12.2* 04/18/2011   HGB 12.8 04/18/2011   HCT 36.9 04/18/2011   MCV 85.4 04/18/2011   PLT 206 04/18/2011    Assessment / Plan: AROM with clear fluid.  Will start pitocin for irregular contractions and no cervical change.  Category 1 tracing.  Brenda Gates JEHIEL 04/18/2011, 5:56 PM

## 2011-04-19 MED ORDER — BENZOCAINE-MENTHOL 20-0.5 % EX AERO
INHALATION_SPRAY | CUTANEOUS | Status: AC
  Administered 2011-04-19: 05:00:00
  Filled 2011-04-19: qty 56

## 2011-04-19 MED ORDER — LANOLIN HYDROUS EX OINT: TOPICAL_OINTMENT | CUTANEOUS | Status: DC | PRN

## 2011-04-19 MED ORDER — SIMETHICONE 80 MG PO CHEW: 80.0000 mg | CHEWABLE_TABLET | ORAL | Status: DC | PRN

## 2011-04-19 MED ORDER — WITCH HAZEL-GLYCERIN EX PADS: 1.0000 "application " | MEDICATED_PAD | CUTANEOUS | Status: DC | PRN

## 2011-04-19 MED ORDER — SENNOSIDES-DOCUSATE SODIUM 8.6-50 MG PO TABS: 2.0000 | ORAL_TABLET | Freq: Every day | ORAL | Status: DC

## 2011-04-19 MED ORDER — DIBUCAINE 1 % RE OINT: 1.0000 "application " | TOPICAL_OINTMENT | RECTAL | Status: DC | PRN

## 2011-04-19 MED ORDER — OXYCODONE-ACETAMINOPHEN 5-325 MG PO TABS: 1.0000 | ORAL_TABLET | ORAL | Status: DC | PRN

## 2011-04-19 MED ORDER — ONDANSETRON HCL 4 MG/2ML IJ SOLN: 4.0000 mg | INTRAMUSCULAR | Status: DC | PRN

## 2011-04-19 MED ORDER — PRENATAL MULTIVITAMIN CH
1.0000 | ORAL_TABLET | Freq: Every day | ORAL | Status: DC
  Administered 2011-04-19: 1 via ORAL
  Filled 2011-04-19: qty 1

## 2011-04-19 MED ORDER — IBUPROFEN 600 MG PO TABS
600.0000 mg | ORAL_TABLET | Freq: Four times a day (QID) | ORAL | Status: DC
  Administered 2011-04-19 – 2011-04-20 (×6): 600 mg via ORAL
  Filled 2011-04-19 (×6): qty 1

## 2011-04-19 MED ORDER — ZOLPIDEM TARTRATE 5 MG PO TABS: 5.0000 mg | ORAL_TABLET | Freq: Every evening | ORAL | Status: DC | PRN

## 2011-04-19 MED ORDER — BENZOCAINE-MENTHOL 20-0.5 % EX AERO: 1.0000 "application " | INHALATION_SPRAY | CUTANEOUS | Status: DC | PRN

## 2011-04-19 MED ORDER — DIPHENHYDRAMINE HCL 25 MG PO CAPS: 25.0000 mg | ORAL_CAPSULE | Freq: Four times a day (QID) | ORAL | Status: DC | PRN

## 2011-04-19 MED ORDER — TETANUS-DIPHTH-ACELL PERTUSSIS 5-2.5-18.5 LF-MCG/0.5 IM SUSP
0.5000 mL | Freq: Once | INTRAMUSCULAR | Status: AC
  Administered 2011-04-19: 0.5 mL via INTRAMUSCULAR
  Filled 2011-04-19: qty 0.5

## 2011-04-19 MED ORDER — ONDANSETRON HCL 4 MG PO TABS: 4.0000 mg | ORAL_TABLET | ORAL | Status: DC | PRN

## 2011-04-19 NOTE — Anesthesia Postprocedure Evaluation (Signed)
  Anesthesia Post-op Note  Patient: Scientist, product/process development  Procedure(s) Performed: * No procedures listed *  Patient Location: Mother/Baby  Anesthesia Type: Epidural  Level of Consciousness: awake, alert  and oriented  Airway and Oxygen Therapy: Patient Spontanous Breathing  Post-op Pain: none  Post-op Assessment: Post-op Vital signs reviewed, Patient's Cardiovascular Status Stable, No headache, No backache, No residual numbness and No residual motor weakness  Post-op Vital Signs: Reviewed and stable  Complications: No apparent anesthesia complications

## 2011-04-19 NOTE — Progress Notes (Signed)
UR Chart review completed.  

## 2011-04-19 NOTE — Progress Notes (Signed)
Post Partum Day 1 Subjective: no complaints, up ad lib, voiding and tolerating PO  Objective: Blood pressure 132/87, pulse 83, temperature 98.7 F (37.1 C), temperature source Oral, resp. rate 18, height 5\' 6"  (1.676 m), weight 89.812 kg (198 lb), last menstrual period 06/26/2010, unknown if currently breastfeeding.  Physical Exam:  General: alert, cooperative and no distress Lochia: appropriate Uterine Fundus: firm DVT Evaluation: No evidence of DVT seen on physical exam.   Basename 04/18/11 1400  HGB 12.8  HCT 36.9    Assessment/Plan: Plan for discharge tomorrow and Contraception paragard   LOS: 1 day   Roemello Speyer JEHIEL 04/19/2011, 7:41 AM

## 2011-04-20 NOTE — Progress Notes (Signed)
Post Partum Day 2 Subjective: no complaints, up ad lib, voiding and tolerating PO  Objective: Blood pressure 106/70, pulse 66, temperature 97.8 F (36.6 C), temperature source Oral, resp. rate 20, height 5\' 6"  (1.676 m), weight 89.812 kg (198 lb), last menstrual period 06/26/2010, SpO2 98.00%, unknown if currently breastfeeding.  Physical Exam:  General: alert, cooperative and no distress Lochia: appropriate Uterine Fundus: firm Incision: n/a DVT Evaluation: No evidence of DVT seen on physical exam. Negative Homan's sign.   Basename 04/18/11 1400  HGB 12.8  HCT 36.9    Assessment/Plan: Discharge home   LOS: 2 days   Ala Dach 04/20/2011, 7:08 AM

## 2011-04-20 NOTE — Progress Notes (Signed)
CLINICAL SOCIAL WORK  BRIEF PSYCHOSOCIAL ASSESSMENT  Referred by Nursery on 04/20/11 for history of anxiety Patient Interview  X Family Interview  Other:    PSYCHOSOCIAL DATA:   Lives Alone  Lives with mother Primary Support (Name/Relationship): Degree of support available:   Pt to discharge home with her mother.  There were several family members present during visit.   CURRENT CONCERNS:     None noted Substance Abuse     Behavioral Health Issues    Financial Resources     Abuse/Neglect/Domestic Violence   Cultural/Religious Issues     Post-Acute Placement    Adjustment to Illness     Knowledge/Cognitive Deficit      Other       SOCIAL WORK ASSESSMENT/PLAN: No Further Intervention Required  Psychosocial Support/Ongoing Assessment of Needs Information/Referral to [facility] Other  PATIENT'S/FAMILY'S RESPONSE TO PLAN OF CARE: Pt was in the process of being discharged when SW arrived for assessment.  Pt had anxiety issues in high school but are now resolved.  Pt's mother requested assistance with formula.  SW obtained a case from Charity fundraiser, [place]  SW also provided two gas cards for family per their request.  Pt expressed coping well and had no other needs or concerns.

## 2011-04-20 NOTE — Discharge Summary (Signed)
Obstetric Discharge Summary Reason for Admission: onset of labor Prenatal Procedures: none Intrapartum Procedures: spontaneous vaginal delivery and shoulder dystocia Postpartum Procedures: none Complications-Operative and Postpartum: 1st degree perineal laceration Hemoglobin  Date Value Range Status  04/18/2011 12.8  12.0-15.0 (g/dL) Final     HCT  Date Value Range Status  04/18/2011 36.9  36.0-46.0 (%) Final    Discharge Diagnoses: Term Pregnancy-delivered  Discharge Information: Date: 04/20/2011 Activity: unrestricted Diet: routine Medications: None Condition: stable Instructions: refer to practice specific booklet Discharge to: home Follow-up Information    Follow up with [facility] [place]. Schedule an appointment as soon as possible for a visit in 6 weeks.   Contact information:   [address] [employer] [place] [address]          Newborn Data: Live born female  Birth Weight: 8 lb 11 oz (3941 g) APGAR: 7, 9  Home with mother.  Ala Dach 04/20/2011, 7:09 AM

## 2011-04-21 NOTE — Discharge Summary (Signed)
Documentation reviewed, considered complete.

## 2013-12-13 ENCOUNTER — Encounter (HOSPITAL_COMMUNITY): Payer: Self-pay | Admitting: *Deleted

## 2015-11-15 LAB — OB RESULTS CONSOLE GC/CHLAMYDIA: Chlamydia: POSITIVE

## 2016-02-12 NOTE — L&D Delivery Note (Signed)
Called by staff to present for immediate delivery, upon arrival was told there was should dystocia and RN stated she was not applying force but holding head of baby.  I took over and applied gentle downward traction which prompted delivery of left shoulder in <10sec.  Female infant, NICU arrived to assess baby which was vigorous and crying.  Pt had requested delayed cord clamping of 30 min, NICU did not feel baby needed transfer to warmer for assessment, weight pending.  I returned approx 30 min later to assist father in cutting of cord.   The patient had no laceration. Fundus was firm. EBL was approx 400, pt initially declined assistance with delivery of placenta.  After cord stopped pulsing, pt advised unlikely to have any add'l benefit of no intervention to facilitate delivery and was advised again of increased bleeding risk.  Pt agreed to fundal massage and gentle traction facilitate delivery of placenta. Placenta was delivered intact. Vagina was clear.  Baby was vigorous and doing skin to skin with mother.  Philip Aspen

## 2016-03-04 LAB — OB RESULTS CONSOLE HEPATITIS B SURFACE ANTIGEN: HEP B S AG: NEGATIVE

## 2016-03-04 LAB — OB RESULTS CONSOLE GC/CHLAMYDIA: Gonorrhea: NEGATIVE

## 2016-03-04 LAB — OB RESULTS CONSOLE RUBELLA ANTIBODY, IGM: Rubella: IMMUNE

## 2016-03-04 LAB — OB RESULTS CONSOLE ABO/RH: RH Type: POSITIVE

## 2016-03-04 LAB — OB RESULTS CONSOLE ANTIBODY SCREEN: ANTIBODY SCREEN: NEGATIVE

## 2016-03-04 LAB — OB RESULTS CONSOLE RPR: RPR: NONREACTIVE

## 2016-03-04 LAB — OB RESULTS CONSOLE HIV ANTIBODY (ROUTINE TESTING): HIV: NONREACTIVE

## 2016-03-07 LAB — OB RESULTS CONSOLE GC/CHLAMYDIA: CHLAMYDIA, DNA PROBE: NEGATIVE

## 2016-05-06 ENCOUNTER — Inpatient Hospital Stay (HOSPITAL_COMMUNITY): Payer: Medicaid Other

## 2016-05-06 ENCOUNTER — Inpatient Hospital Stay (HOSPITAL_COMMUNITY)
Admission: AD | Admit: 2016-05-06 | Discharge: 2016-05-06 | Disposition: A | Discharge status: Home / Self Care | Payer: Medicaid Other | Source: Ambulatory Visit | Attending: Obstetrics and Gynecology | Admitting: Obstetrics and Gynecology

## 2016-05-06 ENCOUNTER — Encounter (HOSPITAL_COMMUNITY): Payer: Self-pay | Admitting: *Deleted

## 2016-05-06 DIAGNOSIS — R0602 Shortness of breath: Secondary | ICD-10-CM

## 2016-05-06 DIAGNOSIS — O288 Other abnormal findings on antenatal screening of mother: Secondary | ICD-10-CM

## 2016-05-06 DIAGNOSIS — J45909 Unspecified asthma, uncomplicated: Secondary | ICD-10-CM | POA: Insufficient documentation

## 2016-05-06 DIAGNOSIS — J069 Acute upper respiratory infection, unspecified: Secondary | ICD-10-CM

## 2016-05-06 DIAGNOSIS — O99343 Other mental disorders complicating pregnancy, third trimester: Secondary | ICD-10-CM | POA: Insufficient documentation

## 2016-05-06 DIAGNOSIS — O99513 Diseases of the respiratory system complicating pregnancy, third trimester: Secondary | ICD-10-CM | POA: Insufficient documentation

## 2016-05-06 DIAGNOSIS — O3483 Maternal care for other abnormalities of pelvic organs, third trimester: Secondary | ICD-10-CM | POA: Insufficient documentation

## 2016-05-06 DIAGNOSIS — N83209 Unspecified ovarian cyst, unspecified side: Secondary | ICD-10-CM | POA: Diagnosis not present

## 2016-05-06 DIAGNOSIS — Z8249 Family history of ischemic heart disease and other diseases of the circulatory system: Secondary | ICD-10-CM | POA: Insufficient documentation

## 2016-05-06 DIAGNOSIS — Z9889 Other specified postprocedural states: Secondary | ICD-10-CM | POA: Diagnosis not present

## 2016-05-06 DIAGNOSIS — Z833 Family history of diabetes mellitus: Secondary | ICD-10-CM | POA: Diagnosis not present

## 2016-05-06 DIAGNOSIS — F419 Anxiety disorder, unspecified: Secondary | ICD-10-CM | POA: Insufficient documentation

## 2016-05-06 DIAGNOSIS — O403XX0 Polyhydramnios, third trimester, not applicable or unspecified: Secondary | ICD-10-CM | POA: Insufficient documentation

## 2016-05-06 DIAGNOSIS — Z87891 Personal history of nicotine dependence: Secondary | ICD-10-CM | POA: Diagnosis not present

## 2016-05-06 DIAGNOSIS — Z3A34 34 weeks gestation of pregnancy: Secondary | ICD-10-CM | POA: Insufficient documentation

## 2016-05-06 DIAGNOSIS — O289 Unspecified abnormal findings on antenatal screening of mother: Secondary | ICD-10-CM | POA: Diagnosis not present

## 2016-05-06 HISTORY — DX: Unspecified asthma, uncomplicated: J45.909

## 2016-05-06 LAB — CBC WITH DIFFERENTIAL/PLATELET
Basophils Absolute: 0 10*3/uL (ref 0.0–0.1)
Basophils Relative: 0 %
EOS PCT: 2 %
Eosinophils Absolute: 0.2 10*3/uL (ref 0.0–0.7)
HEMATOCRIT: 30.7 % — AB (ref 36.0–46.0)
Hemoglobin: 10.8 g/dL — ABNORMAL LOW (ref 12.0–15.0)
LYMPHS ABS: 1.8 10*3/uL (ref 0.7–4.0)
LYMPHS PCT: 23 %
MCH: 29.8 pg (ref 26.0–34.0)
MCHC: 35.2 g/dL (ref 30.0–36.0)
MCV: 84.8 fL (ref 78.0–100.0)
Monocytes Absolute: 0.4 10*3/uL (ref 0.1–1.0)
Monocytes Relative: 6 %
NEUTROS ABS: 5.4 10*3/uL (ref 1.7–7.7)
Neutrophils Relative %: 69 %
PLATELETS: 225 10*3/uL (ref 150–400)
RBC: 3.62 MIL/uL — ABNORMAL LOW (ref 3.87–5.11)
RDW: 13.8 % (ref 11.5–15.5)
WBC: 7.8 10*3/uL (ref 4.0–10.5)

## 2016-05-06 LAB — INFLUENZA PANEL BY PCR (TYPE A & B)
Influenza A By PCR: NEGATIVE
Influenza B By PCR: NEGATIVE

## 2016-05-06 MED ORDER — ALBUTEROL SULFATE HFA 108 (90 BASE) MCG/ACT IN AERS: 1.0000 | INHALATION_SPRAY | Freq: Four times a day (QID) | RESPIRATORY_TRACT | 0 refills | Status: AC | PRN

## 2016-05-06 NOTE — MAU Provider Note (Signed)
History     CSN: [id number]  Arrival date and time: 05/06/16 1222   First Provider Initiated Contact with Patient 05/06/16 1306      Chief Complaint  Patient presents with  . Shortness of Breath   HPI   Ms.Brenda Gates is a 26 y.o. female G3P2002 @ [redacted]w[redacted]d with a childhood history of asthma here in MAU with SOB X 2-3 weeks. The SOB became worse today when she was at home. She felt mucus in her chest in the last 2 days due to a cold she has. This morning she felt she was working hard to breath. She does not have an inhaler at home. As not felt like she needed anything until today.   OB History    Gravida Para Term Preterm AB Living   2 1 1  0 0 1   SAB TAB Ectopic Multiple Live Births   0 0 0 0 1      Past Medical History:  Diagnosis Date  . Anxiety   . Chlamydia   . Ovarian cyst     Past Surgical History:  Procedure Laterality Date  . ORTHOPEDIC SURGERY     shoulder dislocation    Family History  Problem Relation Age of Onset  . Anesthesia problems Neg Hx   . Hypertension Mother   . Diabetes Brother   . Diabetes Maternal Aunt   . Diabetes Paternal Aunt   . Hypertension Maternal Grandfather     Social History  Substance Use Topics  . Smoking status: Former Smoker    Quit date: 08/06/2010  . Smokeless tobacco: Not on file  . Alcohol use No    Allergies: No Known Allergies  No prescriptions prior to admission.   Results for orders placed or performed during the hospital encounter of 05/06/16 (from the past 48 hour(s))  CBC with Differential     Status: Abnormal   Collection Time: 05/06/16  1:48 PM  Result Value Ref Range   WBC 7.8 4.0 - 10.5 K/uL   RBC 3.62 (L) 3.87 - 5.11 MIL/uL   Hemoglobin 10.8 (L) 12.0 - 15.0 g/dL   HCT 40.9 (L) 81.1 - 91.4 %   MCV 84.8 78.0 - 100.0 fL   MCH 29.8 26.0 - 34.0 pg   MCHC 35.2 30.0 - 36.0 g/dL   RDW 78.2 95.6 - 21.3 %   Platelets 225 150 - 400 K/uL   Neutrophils Relative % 69 %   Neutro Abs 5.4 1.7 - 7.7 K/uL    Lymphocytes Relative 23 %   Lymphs Abs 1.8 0.7 - 4.0 K/uL   Monocytes Relative 6 %   Monocytes Absolute 0.4 0.1 - 1.0 K/uL   Eosinophils Relative 2 %   Eosinophils Absolute 0.2 0.0 - 0.7 K/uL   Basophils Relative 0 %   Basophils Absolute 0.0 0.0 - 0.1 K/uL  Influenza panel by PCR (type A & B)     Status: None   Collection Time: 05/06/16  4:59 PM  Result Value Ref Range   Influenza A By PCR NEGATIVE NEGATIVE   Influenza B By PCR NEGATIVE NEGATIVE    Comment: (NOTE) The Xpert Xpress Flu assay is intended as an aid in the diagnosis of  influenza and should not be used as a sole basis for treatment.  This  assay is FDA approved for nasopharyngeal swab specimens only. Nasal  washings and aspirates are unacceptable for Xpert Xpress Flu testing.    Dg Chest 2 View  Result Date: 05/06/2016  CLINICAL DATA:  Shortness of breath and cough for several days, tachycardia, question pneumonia, history childhood asthma, [redacted] weeks pregnant EXAM: CHEST  2 VIEW COMPARISON:  None FINDINGS: Normal heart size, mediastinal contours, and pulmonary vascularity. Lungs clear. No pleural effusion or pneumothorax. Bones unremarkable. IMPRESSION: No acute abnormalities. Electronically Signed   By: Ulyses SouthwardMark  Boles M.D.   On: 05/06/2016 14:18    Review of Systems  Constitutional: Negative for fever.  HENT: Positive for congestion. Negative for sore throat.   Respiratory: Positive for cough, shortness of breath and wheezing.    Physical Exam   Blood pressure (!) 93/59, pulse 98, temperature 99.3 F (37.4 C), temperature source Oral, resp. rate 16, SpO2 100 %, unknown if currently breastfeeding.  Physical Exam  Constitutional: She is oriented to person, place, and time. She appears well-developed and well-nourished.  Non-toxic appearance. She has a sickly appearance. She does not appear ill. No distress.  Respiratory: Effort normal. No tachypnea. She has no decreased breath sounds. She has rhonchi in the right upper  field and the left upper field.  Musculoskeletal: Normal range of motion.  Neurological: She is alert and oriented to person, place, and time.  Skin: Skin is warm. She is not diaphoretic.  Psychiatric: Her behavior is normal.   Fetal Tracing: Baseline: 120 bpm Variability: Moderate  Accelerations: 15x15 Decelerations: none Toco: None  MAU Course  Procedures  None  MDM  Chest xray: negative Flu swab collected and pending.  Initially NST: reassuring, however not reactive. 10x10, with moderate variability. BPP ordered. BPP 8/8 & polyhydramnios' : discussed with Dr. Claiborne Billingsallahan. Ok to [facility] home.   Assessment and Plan   A:  1. Polyhydramnios affecting pregnancy in third trimester   2. Non-reactive NST (non-stress test)   3. Upper respiratory virus   4. Shortness of breath     P:  Discharge home in stable condition Rx: Albuterol Cool mist humidifier  Call the office tomorrow for follow up. Patient will need repeat US in 1 week  Kick counts Return to MAU if symptoms worsen  Flu negative.    Duane LopeJennifer I Rasch, NP 05/06/2016 7:41 PM

## 2016-05-06 NOTE — Discharge Instructions (Signed)
Polyhydramnios When a woman becomes pregnant, a sac is formed around the fertilized egg (embryo) and later the growing baby (fetus). This sac is called the amniotic sac. The amniotic sac is filled with fluid. It gets bigger as the pregnancy grows. When there is too much fluid in the sac, it is called polyhydramnios. All babies born with polyhydramnios should be checked for congenital abnormalities. The amniotic fluid cushions and protects the baby. It also provides the baby with fluids and is crucial to normal development. Your baby breathes this fluid into its lungs and swallows it. This helps promote the healthy growth of the lungs and gastrointestinal tract. Amniotic fluid also helps the baby move around, helping with the normal development of muscle and bone. What are the causes? This condition is caused by:  Diabetes mellitus.  Downs syndrome, fetal abnormalities of the intestinal tract, and anencephaly (fetus without brain), all of which can prevent the fetus from swallowing the amniotic fluid.  One twins passes (transfuses) its blood into the other twin (twin-twin transfusion syndrome).  Medical illness of the mother, e.g., kidney or heart disease.  Tumor (chorioangioma) of the placenta. What are the signs or symptoms? Symptoms of this condition include:  Enlarged uterus. The size of the uterus enlarges beyond what it should be for that particular time of the pregnancy.  Pressure and discomfort. The mother may feel more pressure and discomfort than should be expected.  Quick and unexpected enlargement of the mothers stomach. How is this diagnosed? This condition is diagnosed when your health care provider measures you and notices that your uterus is beyond the size that is consistent with the time of the pregnancy.  An ultrasound is then used (abdominally or vaginally) to:  See if you are carrying twins or more.  Measure the growth of the baby.  Look for birth  defects.  Measure the amount of fluid in the amniotic sac.  Amniotic Fluid Index (AFI) measures the amount of fluid in the amniotic sac in four different areas. If there is more than 24 centimeters, you have polyhydramnios. How is this treated? This condition may be treated by:  Removing some fluid from the amniotic sac.  Taking medications that lower the fluids in your body.  Stopping the use of salt or salty foods because it causes you to keep fluid in your body (retention). Follow these instructions at home:  Keep all your prenatal visits as told by your health care provider. It is important to follow your health care providers recommendations.  Do not eat a lot of salt and salty foods.  If you have diabetes, keep it under control.  If you have heart or kidney disease, treat the disease as told by your health care provider. Contact a health care provider if:  You think your uterus has grown too fast in a short period of time.  You feel a great amount of pressure in your lower belly (pelvis) and are more uncomfortable than expected. Get help right away if:  You have a gush of fluid or are leaking fluid from your vagina.  You stop feeling the baby move.  You do not feel the baby kicking as much as usual.  You have a hard time keeping your diabetes under control.  You are having problems with your heart or kidney disease. This information is not intended to replace advice given to you by your health care provider. Make sure you discuss any questions you have with your health care provider. Document  Released: 04/20/2002 Document Revised: 07/06/2015 Document Reviewed: 08/27/2012 Elsevier Interactive Patient Education  2017 [employer]

## 2016-05-06 NOTE — MAU Note (Signed)
Pt states she had childhood asthma, feels it is coming back.  Is SOB, feels like she is wheezing & that she has mucus in her chest.  No apparent respiratory distress in triage.  Denies pain.  Denies contractions, bleeding or LOF.

## 2016-05-09 LAB — OB RESULTS CONSOLE GBS: GBS: NEGATIVE

## 2016-06-05 ENCOUNTER — Telehealth (HOSPITAL_COMMUNITY): Payer: Self-pay | Admitting: *Deleted

## 2016-06-05 ENCOUNTER — Other Ambulatory Visit: Payer: Self-pay | Admitting: Obstetrics and Gynecology

## 2016-06-05 ENCOUNTER — Encounter (HOSPITAL_COMMUNITY): Payer: Self-pay | Admitting: *Deleted

## 2016-06-05 NOTE — Telephone Encounter (Signed)
Preadmission screen  

## 2016-06-06 ENCOUNTER — Inpatient Hospital Stay (HOSPITAL_COMMUNITY)
Admission: AD | Admit: 2016-06-06 | Discharge: 2016-06-09 | DRG: 774 | Disposition: A | Discharge status: Home / Self Care | Payer: Medicaid Other | Source: Ambulatory Visit | Attending: Obstetrics and Gynecology | Admitting: Obstetrics and Gynecology

## 2016-06-06 ENCOUNTER — Encounter (HOSPITAL_COMMUNITY): Payer: Self-pay

## 2016-06-06 DIAGNOSIS — O9832 Other infections with a predominantly sexual mode of transmission complicating childbirth: Secondary | ICD-10-CM | POA: Diagnosis present

## 2016-06-06 DIAGNOSIS — Z3493 Encounter for supervision of normal pregnancy, unspecified, third trimester: Secondary | ICD-10-CM | POA: Diagnosis present

## 2016-06-06 DIAGNOSIS — O9902 Anemia complicating childbirth: Secondary | ICD-10-CM | POA: Diagnosis present

## 2016-06-06 DIAGNOSIS — Z3A39 39 weeks gestation of pregnancy: Secondary | ICD-10-CM

## 2016-06-06 DIAGNOSIS — D573 Sickle-cell trait: Secondary | ICD-10-CM | POA: Diagnosis present

## 2016-06-06 DIAGNOSIS — A6000 Herpesviral infection of urogenital system, unspecified: Secondary | ICD-10-CM | POA: Diagnosis present

## 2016-06-06 DIAGNOSIS — Z87891 Personal history of nicotine dependence: Secondary | ICD-10-CM | POA: Diagnosis not present

## 2016-06-06 LAB — CBC
HEMATOCRIT: 33.8 % — AB (ref 36.0–46.0)
Hemoglobin: 11.7 g/dL — ABNORMAL LOW (ref 12.0–15.0)
MCH: 28.6 pg (ref 26.0–34.0)
MCHC: 34.6 g/dL (ref 30.0–36.0)
MCV: 82.6 fL (ref 78.0–100.0)
PLATELETS: 229 10*3/uL (ref 150–400)
RBC: 4.09 MIL/uL (ref 3.87–5.11)
RDW: 14.6 % (ref 11.5–15.5)
WBC: 10.5 10*3/uL (ref 4.0–10.5)

## 2016-06-06 LAB — TYPE AND SCREEN
ABO/RH(D): B POS
ANTIBODY SCREEN: NEGATIVE

## 2016-06-06 MED ORDER — LACTATED RINGERS IV SOLN
INTRAVENOUS | Status: DC
  Administered 2016-06-06 – 2016-06-07 (×2): via INTRAVENOUS

## 2016-06-06 MED ORDER — OXYTOCIN BOLUS FROM INFUSION
500.0000 mL | Freq: Once | INTRAVENOUS | Status: AC
  Administered 2016-06-07: 500 mL via INTRAVENOUS

## 2016-06-06 MED ORDER — BUTORPHANOL TARTRATE 1 MG/ML IJ SOLN
INTRAMUSCULAR | Status: AC
Start: 2016-06-06 — End: 2016-06-07
  Filled 2016-06-06: qty 1

## 2016-06-06 MED ORDER — LIDOCAINE HCL (PF) 1 % IJ SOLN
30.0000 mL | INTRAMUSCULAR | Status: DC | PRN
  Filled 2016-06-06: qty 30

## 2016-06-06 MED ORDER — OXYCODONE-ACETAMINOPHEN 5-325 MG PO TABS: 2.0000 | ORAL_TABLET | ORAL | Status: DC | PRN

## 2016-06-06 MED ORDER — OXYCODONE-ACETAMINOPHEN 5-325 MG PO TABS
1.0000 | ORAL_TABLET | ORAL | Status: DC | PRN
  Administered 2016-06-07: 1 via ORAL
  Filled 2016-06-06: qty 1

## 2016-06-06 MED ORDER — ONDANSETRON HCL 4 MG/2ML IJ SOLN: 4.0000 mg | Freq: Four times a day (QID) | INTRAMUSCULAR | Status: DC | PRN

## 2016-06-06 MED ORDER — SOD CITRATE-CITRIC ACID 500-334 MG/5ML PO SOLN: 30.0000 mL | ORAL | Status: DC | PRN

## 2016-06-06 MED ORDER — LACTATED RINGERS IV SOLN: 500.0000 mL | INTRAVENOUS | Status: DC | PRN

## 2016-06-06 MED ORDER — ACETAMINOPHEN 325 MG PO TABS: 650.0000 mg | ORAL_TABLET | ORAL | Status: DC | PRN

## 2016-06-06 MED ORDER — OXYTOCIN 40 UNITS IN LACTATED RINGERS INFUSION - SIMPLE MED
2.5000 [IU]/h | INTRAVENOUS | Status: DC
  Filled 2016-06-06 (×2): qty 1000

## 2016-06-06 MED ORDER — FLEET ENEMA 7-19 GM/118ML RE ENEM: 1.0000 | ENEMA | RECTAL | Status: DC | PRN

## 2016-06-06 MED ORDER — BUTORPHANOL TARTRATE 1 MG/ML IJ SOLN
1.0000 mg | INTRAMUSCULAR | Status: DC | PRN
  Administered 2016-06-06 – 2016-06-07 (×5): 1 mg via INTRAVENOUS
  Filled 2016-06-06 (×4): qty 1

## 2016-06-06 NOTE — MAU Note (Signed)
Presents with contractions approximately 1-2 minutes apart.  No LOF.  Good FM.  Some spotting when wiping.

## 2016-06-07 ENCOUNTER — Encounter (HOSPITAL_COMMUNITY): Payer: Self-pay | Admitting: *Deleted

## 2016-06-07 LAB — RPR: RPR: NONREACTIVE

## 2016-06-07 LAB — ABO/RH: ABO/RH(D): B POS

## 2016-06-07 MED ORDER — DIPHENHYDRAMINE HCL 25 MG PO CAPS: 25.0000 mg | ORAL_CAPSULE | Freq: Four times a day (QID) | ORAL | Status: DC | PRN

## 2016-06-07 MED ORDER — ONDANSETRON HCL 4 MG/2ML IJ SOLN: 4.0000 mg | INTRAMUSCULAR | Status: DC | PRN

## 2016-06-07 MED ORDER — PHENYLEPHRINE 40 MCG/ML (10ML) SYRINGE FOR IV PUSH (FOR BLOOD PRESSURE SUPPORT)
80.0000 ug | PREFILLED_SYRINGE | INTRAVENOUS | Status: DC | PRN
  Filled 2016-06-07: qty 5
  Filled 2016-06-07: qty 10

## 2016-06-07 MED ORDER — OXYCODONE-ACETAMINOPHEN 5-325 MG PO TABS
2.0000 | ORAL_TABLET | ORAL | Status: DC | PRN
  Filled 2016-06-07: qty 2

## 2016-06-07 MED ORDER — PRENATAL MULTIVITAMIN CH
1.0000 | ORAL_TABLET | Freq: Every day | ORAL | Status: DC
  Administered 2016-06-08: 1 via ORAL
  Filled 2016-06-07: qty 1

## 2016-06-07 MED ORDER — EPHEDRINE 5 MG/ML INJ
10.0000 mg | INTRAVENOUS | Status: DC | PRN
  Filled 2016-06-07: qty 2

## 2016-06-07 MED ORDER — WITCH HAZEL-GLYCERIN EX PADS: 1.0000 "application " | MEDICATED_PAD | CUTANEOUS | Status: DC | PRN

## 2016-06-07 MED ORDER — ZOLPIDEM TARTRATE 5 MG PO TABS: 5.0000 mg | ORAL_TABLET | Freq: Every evening | ORAL | Status: DC | PRN

## 2016-06-07 MED ORDER — SENNOSIDES-DOCUSATE SODIUM 8.6-50 MG PO TABS
2.0000 | ORAL_TABLET | ORAL | Status: DC
  Administered 2016-06-07 – 2016-06-09 (×2): 2 via ORAL
  Filled 2016-06-07 (×2): qty 2

## 2016-06-07 MED ORDER — LACTATED RINGERS IV SOLN: 500.0000 mL | Freq: Once | INTRAVENOUS | Status: DC

## 2016-06-07 MED ORDER — OXYTOCIN 40 UNITS IN LACTATED RINGERS INFUSION - SIMPLE MED
1.0000 m[IU]/min | INTRAVENOUS | Status: DC
  Administered 2016-06-07: 2 m[IU]/min via INTRAVENOUS

## 2016-06-07 MED ORDER — TETANUS-DIPHTH-ACELL PERTUSSIS 5-2.5-18.5 LF-MCG/0.5 IM SUSP: 0.5000 mL | Freq: Once | INTRAMUSCULAR | Status: DC

## 2016-06-07 MED ORDER — IBUPROFEN 600 MG PO TABS
600.0000 mg | ORAL_TABLET | Freq: Four times a day (QID) | ORAL | Status: DC
  Administered 2016-06-07 – 2016-06-09 (×8): 600 mg via ORAL
  Filled 2016-06-07 (×8): qty 1

## 2016-06-07 MED ORDER — ACETAMINOPHEN 325 MG PO TABS: 650.0000 mg | ORAL_TABLET | ORAL | Status: DC | PRN

## 2016-06-07 MED ORDER — SIMETHICONE 80 MG PO CHEW: 80.0000 mg | CHEWABLE_TABLET | ORAL | Status: DC | PRN

## 2016-06-07 MED ORDER — ONDANSETRON HCL 4 MG PO TABS: 4.0000 mg | ORAL_TABLET | ORAL | Status: DC | PRN

## 2016-06-07 MED ORDER — FENTANYL 2.5 MCG/ML BUPIVACAINE 1/10 % EPIDURAL INFUSION (WH - ANES)
14.0000 mL/h | INTRAMUSCULAR | Status: DC | PRN
  Filled 2016-06-07: qty 100

## 2016-06-07 MED ORDER — OXYCODONE-ACETAMINOPHEN 5-325 MG PO TABS
1.0000 | ORAL_TABLET | ORAL | Status: DC | PRN
  Administered 2016-06-07 – 2016-06-08 (×2): 1 via ORAL
  Filled 2016-06-07: qty 1

## 2016-06-07 MED ORDER — DIPHENHYDRAMINE HCL 50 MG/ML IJ SOLN: 12.5000 mg | INTRAMUSCULAR | Status: DC | PRN

## 2016-06-07 MED ORDER — COCONUT OIL OIL: 1.0000 "application " | TOPICAL_OIL | Status: DC | PRN

## 2016-06-07 MED ORDER — DIBUCAINE 1 % RE OINT: 1.0000 "application " | TOPICAL_OINTMENT | RECTAL | Status: DC | PRN

## 2016-06-07 MED ORDER — PHENYLEPHRINE 40 MCG/ML (10ML) SYRINGE FOR IV PUSH (FOR BLOOD PRESSURE SUPPORT)
80.0000 ug | PREFILLED_SYRINGE | INTRAVENOUS | Status: DC | PRN
  Filled 2016-06-07: qty 5

## 2016-06-07 MED ORDER — BENZOCAINE-MENTHOL 20-0.5 % EX AERO
1.0000 "application " | INHALATION_SPRAY | CUTANEOUS | Status: DC | PRN
  Administered 2016-06-07: 1 via TOPICAL
  Filled 2016-06-07: qty 56

## 2016-06-07 NOTE — Anesthesia Pain Management Evaluation Note (Signed)
  CRNA Pain Management Visit Note  Patient: Brenda Gates, 26 y.o., female  "Hello I am a member of the anesthesia team at [facility]. We have an anesthesia team available at all times to provide care throughout the hospital, including epidural management and anesthesia for C-section. I don't know your plan for the delivery whether it a natural birth, water birth, IV sedation, nitrous supplementation, doula or epidural, but we want to meet your pain goals."   1.Was your pain managed to your expectations on prior hospitalizations?   Yes   2.What is your expectation for pain management during this hospitalization?     IV pain meds  3.How can we help you reach that goal? unsure  Record the patient's initial score and the patient's pain goal.   Pain: 8  Pain Goal: 9 [facility] wants you to be able to say your pain was always managed very well.  Cephus Shelling 06/07/2016

## 2016-06-07 NOTE — H&P (Signed)
25 y.o. [redacted]w[redacted]d  G3P2002 comes in c/o labor.  Otherwise has good fetal movement and no bleeding.  Past Medical History:  Diagnosis Date  . Anxiety   . Asthma   . Chlamydia   . History of varicella   . HSV (herpes simplex virus) anogenital infection   . Ovarian cyst   . Sickle cell trait [facility])     Past Surgical History:  Procedure Laterality Date  . ORTHOPEDIC SURGERY     shoulder dislocation    OB History  Gravida Para Term Preterm AB Living  0 0 2  SAB TAB Ectopic Multiple Live Births  0 0 0 0 2    # Outcome Date GA Lbr Len/2nd Weight Sex Delivery Anes PTL Lv  3 Current           2 Term 41 [redacted]w[redacted]d   M Vag-Spont   LIV  1 Term 04/18/11 [redacted]w[redacted]d 11:00 / 00:59 8 lb 11 oz (3.941 kg) M Vag-Spont EPI  LIV      Social History   Social History  . Marital status: Single    Spouse name: N/A  . Number of children: N/A  . Years of education: N/A   Occupational History  . Not on file.   Social History Main Topics  . Smoking status: Former Smoker    Quit date: 08/06/2010  . Smokeless tobacco: Former Neurosurgeon  . Alcohol use No  . Drug use: No  . Sexual activity: Yes    Birth control/ protection: None   Other Topics Concern  . Not on file   Social History Narrative  . No narrative on file   Patient has no known allergies.    Prenatal Transfer Tool  Maternal Diabetes: No Genetic Screening: incomplete- not reported Maternal Ultrasounds/Referrals: Normal Fetal Ultrasounds or other Referrals:  None Maternal Substance Abuse:  No Significant Maternal Medications:  None Significant Maternal Lab Results: Lab values include: Other: pt  Had CT in early pregnancy and was treated.  F/u CT was negative.  Pt is ss trait carrier.   Other PNC: uncomplicated.    Vitals:   06/06/16 2058 06/07/16 0001  BP: 116/87 114/84  Pulse: 91 84  Resp: 18 16  Temp: 99.3 F (37.4 C) 99 F (37.2 C)     Lungs/Cor:  NAD Abdomen:  soft, gravid Ex:  no cords, erythema SVE:  6/80/-2 on  admission; no lesions seen by MAU NP on spec exam FHTs:  120s, good STV, NST R, cat 1 Toco:  q 2-3   A/P   Labor.  Pt on valtrex for genital HSV-no recent outbreaks and no lesions per NP.  GBS neg.  Abhi Moccia A

## 2016-06-07 NOTE — Lactation Note (Signed)
This note was copied from a baby's chart. Lactation Consultation Note  Patient Name: Brenda Gates ZOXWR'U Date: 06/07/2016 Reason for consult: Initial assessment   Initial assessment with Exp BF mom of 1 hour old infant in [place]. Mom reports she BF her 26 yo for 2-3 months. She is planning to pump and bottle feed this infant.   Infant has fed for 10 minutes prior to [facility] coming into room. Mom reports she does not feel she has any milk to feed infant. Showed her how to hand express. A few gtts expressed from right breast, 1 ml easily expressed from left breast and was spoon fed to infant.. Mom then asked to latch infant to left breast. Discussed supply and demand, colostrum and milk coming to volume. Mom reports she plans to bottle feed infant tonight. Discussed supply and demand and risk of engorgement, mom voiced understanding.   Mom with large compressible breasts and areola and everted nipples. Infant placed to breast in the cross cradle hold. He latched readily with active suckling and intermittent swallows. Enc mom to offer both breast with feeding.   Discussed starting to pump to promote milk production if mom not wanting to latch infant to breast, mom voiced understanding and is to inform her PP RN is she would like to pump. Enc mom to feed infant STS 8-12 x a day if she plans to BF. Enc mom to offer breast prior to offering formula to promote and protect milk supply.   Mom reports she is not a [facility] client and has a manual pump at home for use. She plans to use manual pump at home. BF Resources Handout and LC Brochure given, mom informed of IP/OP services, BF Support Groups and LC phone #. Enc mom to call out for feeding assistance as needed. Mom without questions/concerns at this time.    Maternal Data Formula Feeding for Exclusion: Yes Reason for exclusion: Mother's choice to formula and breast feed on admission Has patient been taught Hand Expression?: Yes Does the  patient have breastfeeding experience prior to this delivery?: Yes  Feeding Feeding Type: Breast Fed Length of feed: 10 min (still feeding when LC left room)  LATCH Score/Interventions Latch: Grasps breast easily, tongue down, lips flanged, rhythmical sucking. Intervention(s): Adjust position;Assist with latch;Breast massage;Breast compression  Audible Swallowing: Spontaneous and intermittent  Type of Nipple: Everted at rest and after stimulation  Comfort (Breast/Nipple): Soft / non-tender     Hold (Positioning): Assistance needed to correctly position infant at breast and maintain latch.  LATCH Score: 9  Lactation Tools Discussed/Used WIC Program: No   Consult Status Consult Status: Follow-up Date: 06/08/16 Follow-up type: In-patient    Silas Flood Tyliah Schlereth 06/07/2016, 12:08 PM

## 2016-06-08 LAB — CBC
HEMATOCRIT: 25.6 % — AB (ref 36.0–46.0)
HEMOGLOBIN: 9 g/dL — AB (ref 12.0–15.0)
MCH: 29.2 pg (ref 26.0–34.0)
MCHC: 35.2 g/dL (ref 30.0–36.0)
MCV: 83.1 fL (ref 78.0–100.0)
Platelets: 189 10*3/uL (ref 150–400)
RBC: 3.08 MIL/uL — ABNORMAL LOW (ref 3.87–5.11)
RDW: 14.9 % (ref 11.5–15.5)
WBC: 13.3 10*3/uL — ABNORMAL HIGH (ref 4.0–10.5)

## 2016-06-08 MED ORDER — FERROUS SULFATE 325 (65 FE) MG PO TABS
325.0000 mg | ORAL_TABLET | Freq: Every day | ORAL | Status: DC
  Administered 2016-06-08 – 2016-06-09 (×2): 325 mg via ORAL
  Filled 2016-06-08 (×2): qty 1

## 2016-06-08 NOTE — Lactation Note (Signed)
This note was copied from a baby's chart. Lactation Consultation Note  Patient Name: Brenda Gates ZOXWR'U Date: 06/08/2016 Reason for consult: Follow-up assessment   Follow up with mom of 31 hour old infant. Infant awake and mom changing diaper and getting ready to give a bottle.   Infant with 3 BF for 10 minutes, formula x 3 of 12-25 cc, 2 voids and 3 stool in last 24 hours. Mom reports infant is latching well and denies nipple pain/tenderness.  Mom reports she would like to start pumping. DEBP set up with instructions for assembling, disassembling and cleaning of pump parts. Enc mom to pump 8 x a day for 15 minutes on Initiate setting to initiate and maintain a supply. Mom is planning to pump with a manual pump at home.   Mom without questions/concerns. Enc mom to call out for assistance as needed. Mom declined need for assistance with latch today.    Maternal Data Formula Feeding for Exclusion: Yes Reason for exclusion: Mother's choice to formula and breast feed on admission Has patient been taught Hand Expression?: Yes  Feeding Feeding Type: Formula Nipple Type: Slow - flow  LATCH Score/Interventions                      Lactation Tools Discussed/Used WIC Program: No Pump Review: Setup, frequency, and cleaning;Milk Storage Initiated by:: Noralee Stain, RN, IBCLC Date initiated:: 06/08/16   Consult Status Consult Status: Follow-up Date: 06/09/16 Follow-up type: In-patient    Ed Blalock 06/08/2016, 6:39 PM

## 2016-06-08 NOTE — Progress Notes (Signed)
Patient is eating, ambulating, voiding.  Pain control is good.  Appropriate lochia, no complaints.  Vitals:   06/07/16 1336 06/07/16 1740 06/08/16 0124 06/08/16 0626  BP: 109/80 121/76 98/66 103/68  Pulse: 65 72 83 67  Resp: Temp: 98.9 F (37.2 C) 98.2 F (36.8 C) 97.7 F (36.5 C) 98.1 F (36.7 C)  TempSrc: Oral Oral Oral Oral  SpO2:   100%   Weight:      Height:        Fundus firm Perineum without swelling. No CT  Lab Results  Component Value Date   WBC 13.3 (H) 06/08/2016   HGB 9.0 (L) 06/08/2016   HCT 25.6 (L) 06/08/2016   MCV 83.1 06/08/2016   PLT 189 06/08/2016    --/--/B POS, B POS (04/26 2040)  A/P Post partum day 1 Circ in office Anemia- add Fe daily  Routine care.  Expect d/c later today or tomorrow per pt request.    Philip Aspen

## 2016-06-09 NOTE — Discharge Summary (Signed)
Obstetric Discharge Summary Reason for Admission: onset of labor Prenatal Procedures: ultrasound Intrapartum Procedures: spontaneous vaginal delivery Postpartum Procedures: none Complications-Operative and Postpartum: none Hemoglobin  Date Value Ref Range Status  06/08/2016 9.0 (L) 12.0 - 15.0 g/dL Final    Comment:    REPEATED TO VERIFY DELTA CHECK NOTED    HCT  Date Value Ref Range Status  06/08/2016 25.6 (L) 36.0 - 46.0 % Final    Physical Exam:  General: alert and cooperative Lochia: appropriate Uterine Fundus: firm DVT Evaluation: No evidence of DVT seen on physical exam.  Discharge Diagnoses: Term Pregnancy-delivered  Discharge Information: Date: 06/09/2016 Activity: pelvic rest Diet: routine Medications: PNV and Ibuprofen Condition: stable Instructions: refer to practice specific booklet Discharge to: home Follow-up Information    Zophia Marrone, DO Follow up in 4 week(s).   Specialty:  Obstetrics and Gynecology Why:  Schedule circumcsion within next 2 weeks Contact information: [address] [place] [address] [phone]           Newborn Data: Live born female  Birth Weight: 7 lb 15.3 oz (3610 g) APGAR: 8, 9  Home with mother.  Brenda Gates 06/09/2016, 9:35 AM

## 2016-06-09 NOTE — Lactation Note (Signed)
This note was copied from a baby's chart. Lactation Consultation Note; Mom just finishing bottle feeding formula as I went into room. Reports last breast fed at 5 am. Reports he is latching well with no pain. Reviewed importance of frequent breast feeding to promote a good milk supply. Has manual pump for home. Used DEBP once here. No questions at present. To call prn  Patient Name: Brenda Gates ZOXWR'U Date: 06/09/2016 Reason for consult: Follow-up assessment   Maternal Data Reason for exclusion: Mother's choice to formula and breast feed on admission Has patient been taught Hand Expression?: Yes Does the patient have breastfeeding experience prior to this delivery?: Yes  Feeding Feeding Type: Bottle Fed - Formula Nipple Type: Slow - flow  LATCH Score/Interventions                      Lactation Tools Discussed/Used     Consult Status Consult Status: Complete    Pamelia Hoit 06/09/2016, 9:17 AM

## 2016-06-11 ENCOUNTER — Inpatient Hospital Stay (HOSPITAL_COMMUNITY): Admission: RE | Admit: 2016-06-11 | Payer: Medicaid Other | Source: Ambulatory Visit

## 2016-07-26 NOTE — Progress Notes (Signed)
Post discharge chart review completed.  

## 2017-09-20 IMAGING — CR DG CHEST 2V
2 series · 2 of 2 positions shown · non-contrast
Comparison: None

CLINICAL DATA: Shortness of breath and cough for several days,
tachycardia, question pneumonia, history childhood asthma, 34 weeks
pregnant

EXAM:
CHEST  2 VIEW

[chest pa]
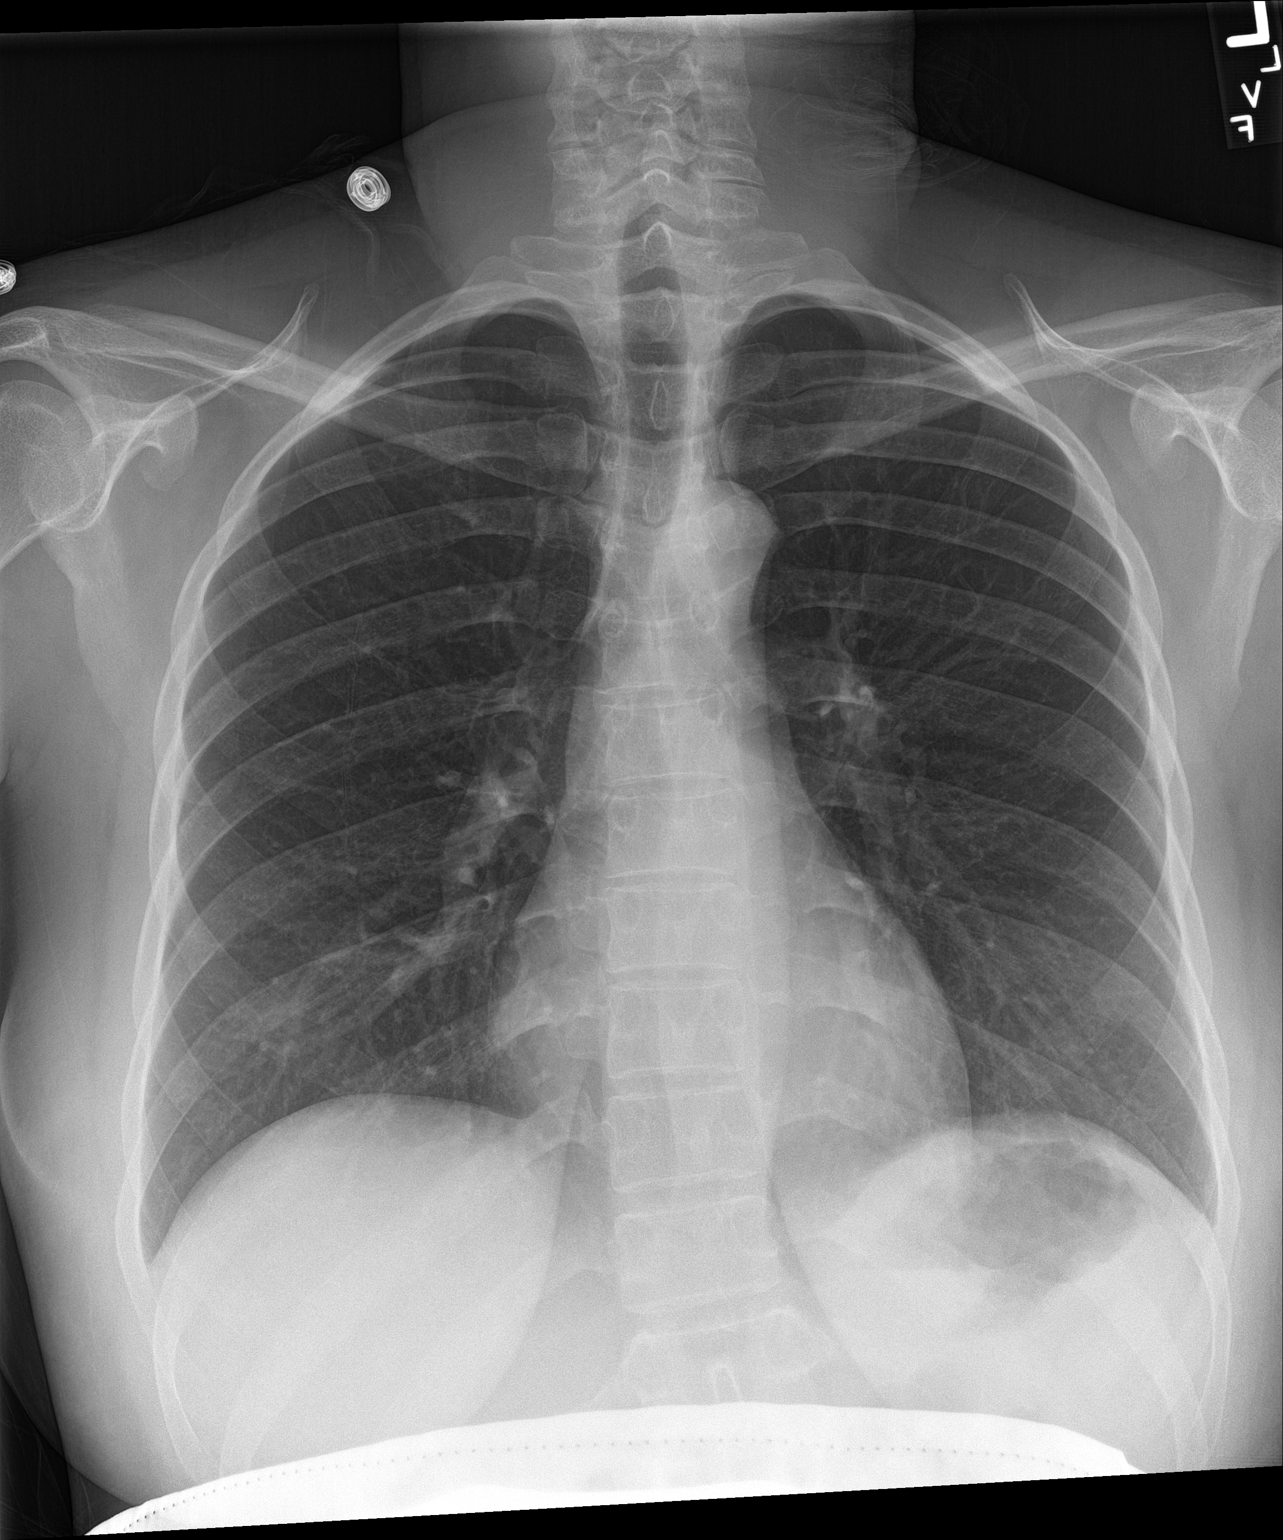

[chest lat]
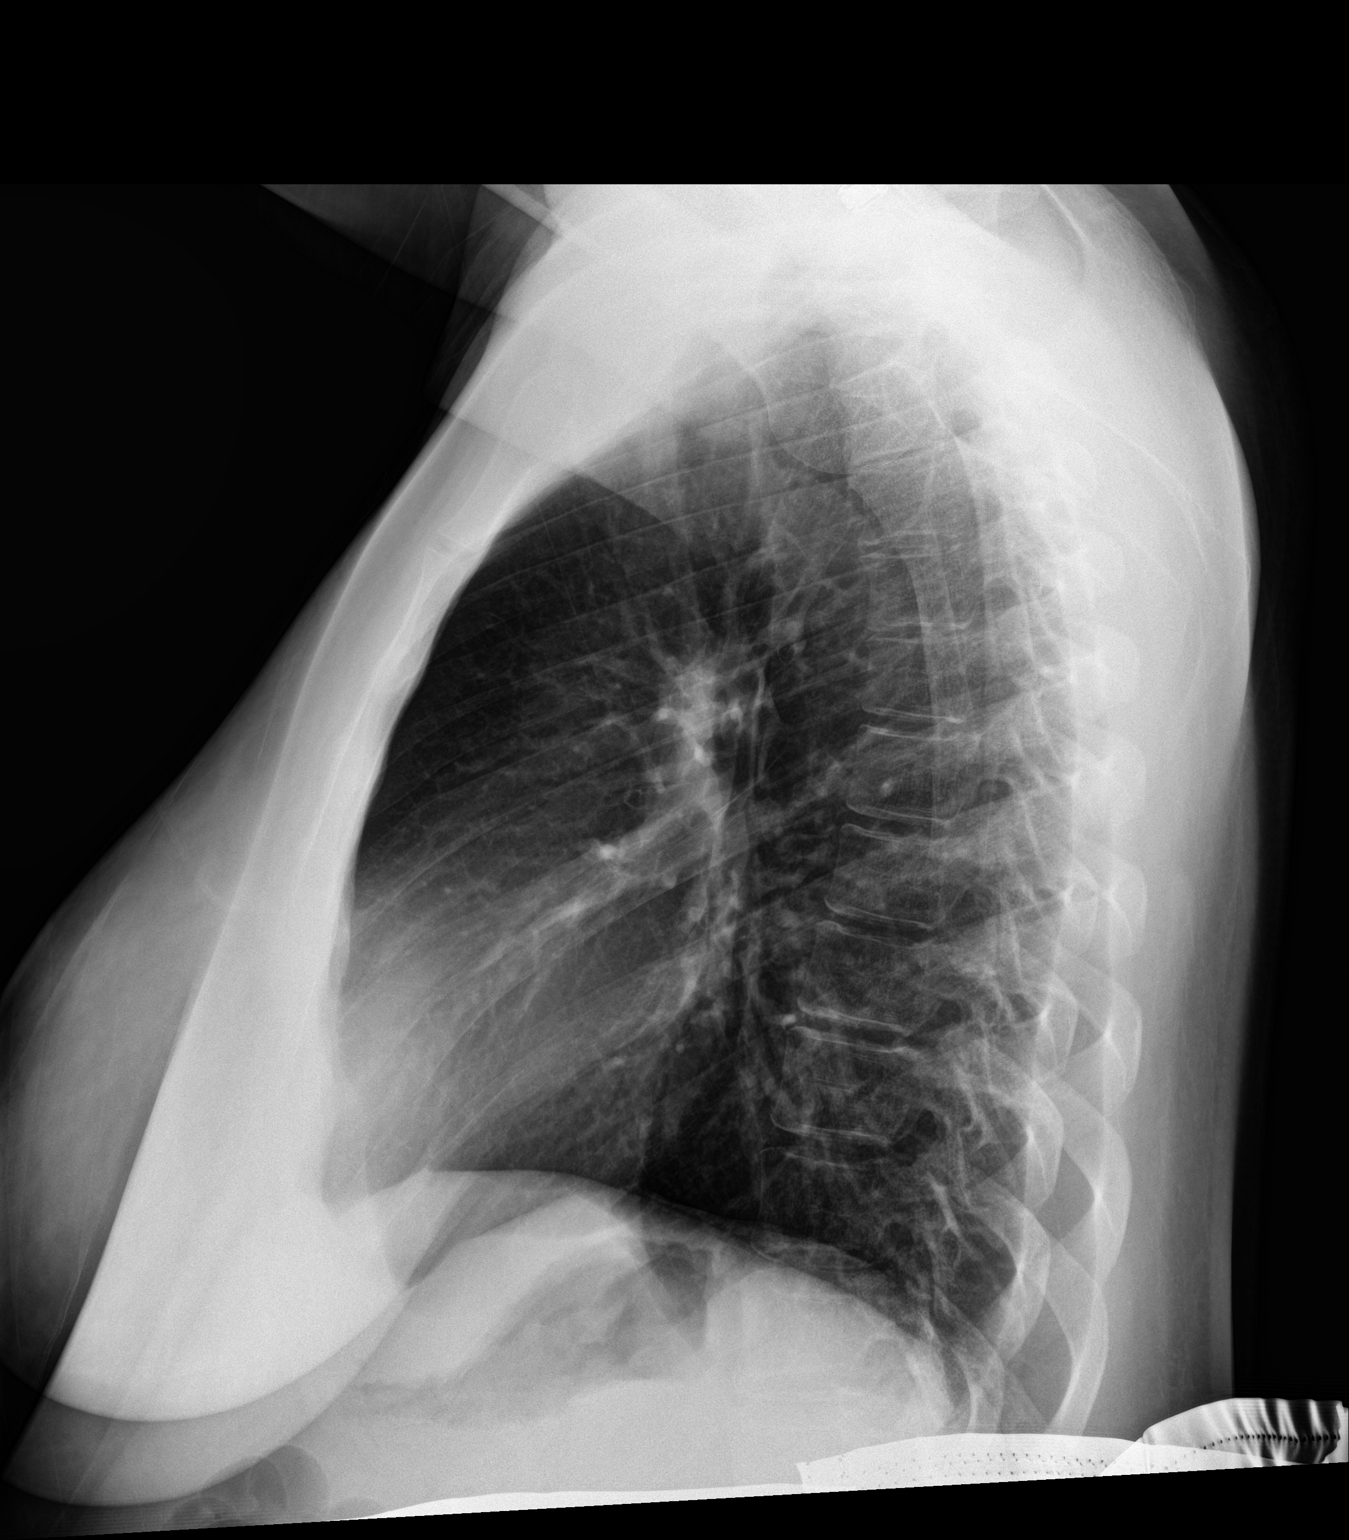

[2 of 2 positions shown; findings below may reference images not displayed]

FINDINGS: Normal heart size, mediastinal contours, and pulmonary vascularity.

Lungs clear.

No pleural effusion or pneumothorax.

Bones unremarkable.
IMPRESSION: No acute abnormalities.

## 2017-09-20 IMAGING — US US MFM FETAL BPP W/O NON-STRESS
1 series · 15 of 25 positions shown · non-contrast
Comparison: none

[Series 1: us mfm fetal bpp w/o non-stress · 25 acquisitions, 15 frames shown]
[im 1/25]
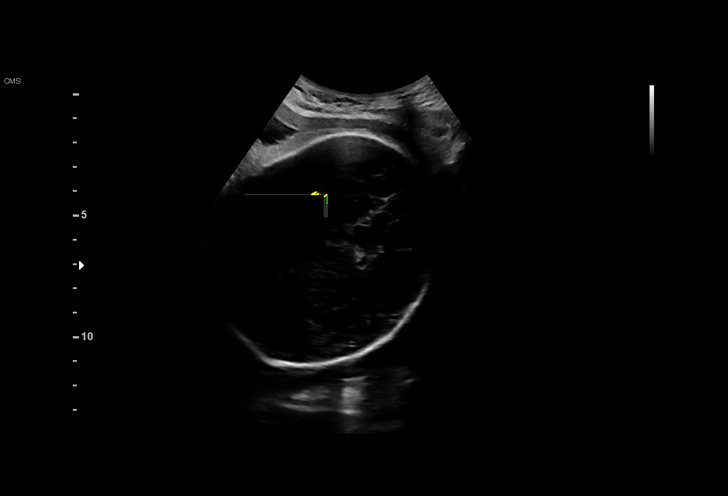
[im 3/25]
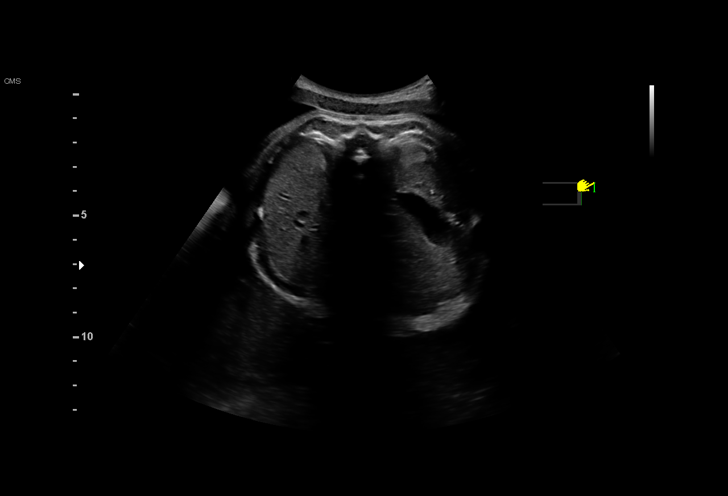
[im 5/25]
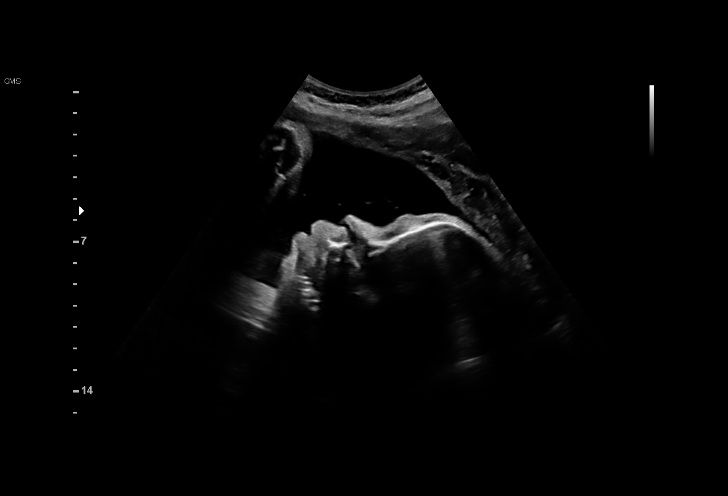
[im 6/25]
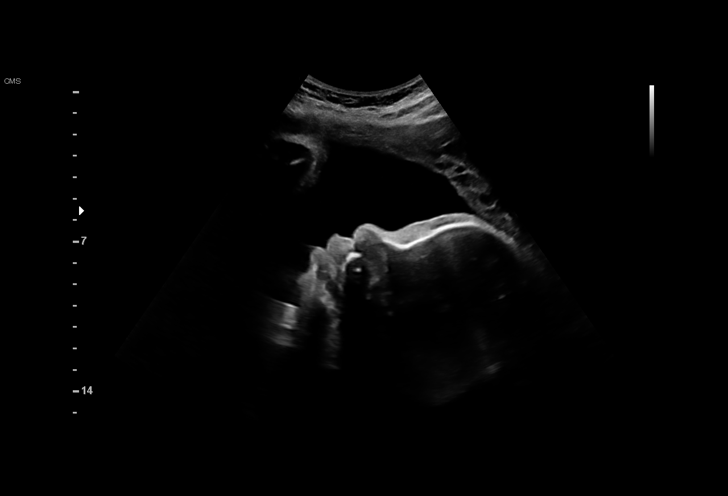
[im 8/25]
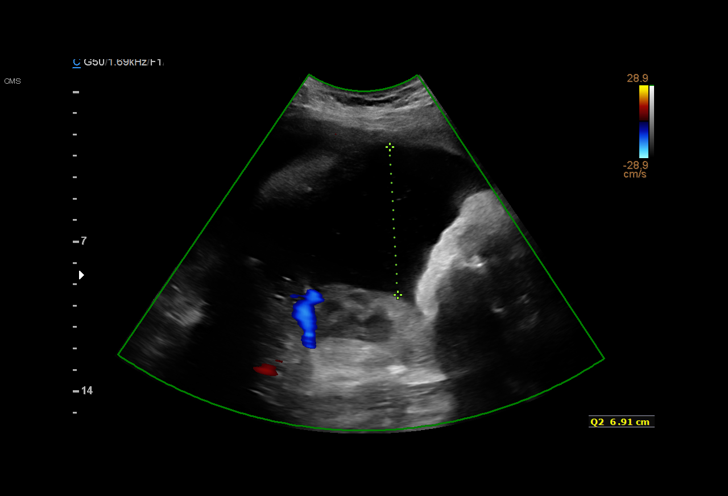
[im 10/25]
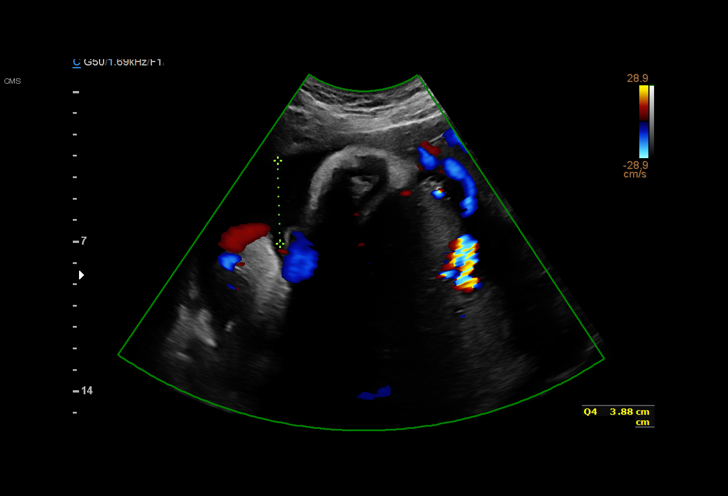
[im 11/25]
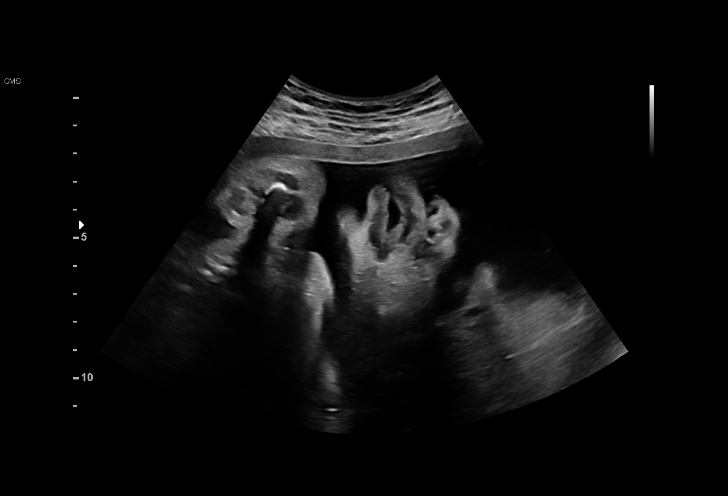
[im 13/25]
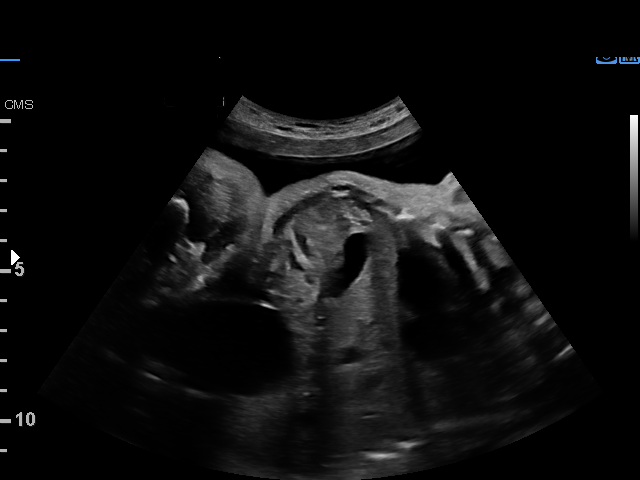
[im 15/25]
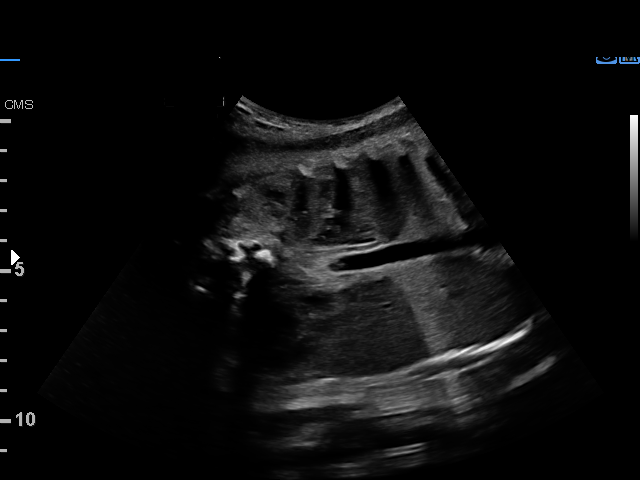
[im 16/25]
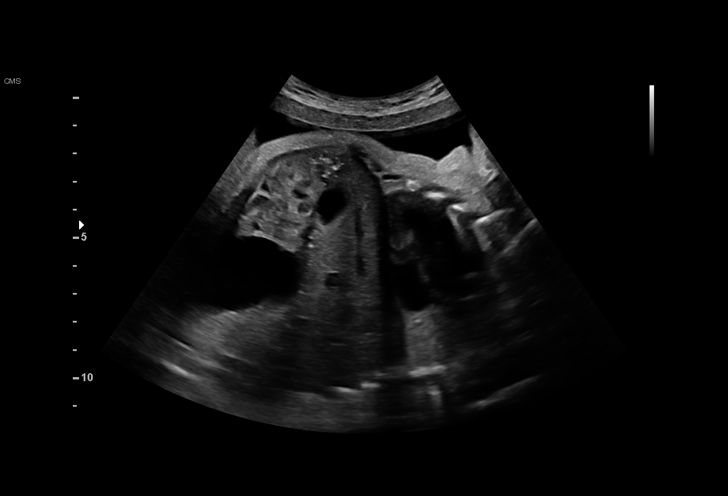
[im 18/25]
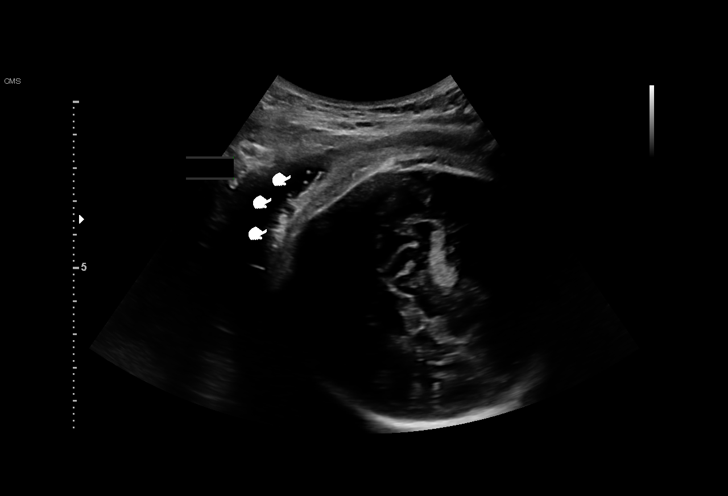
[im 20/25]
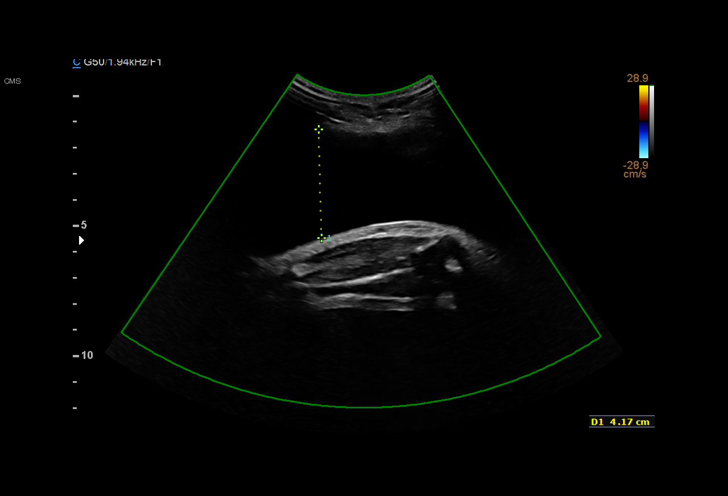
[im 21/25]
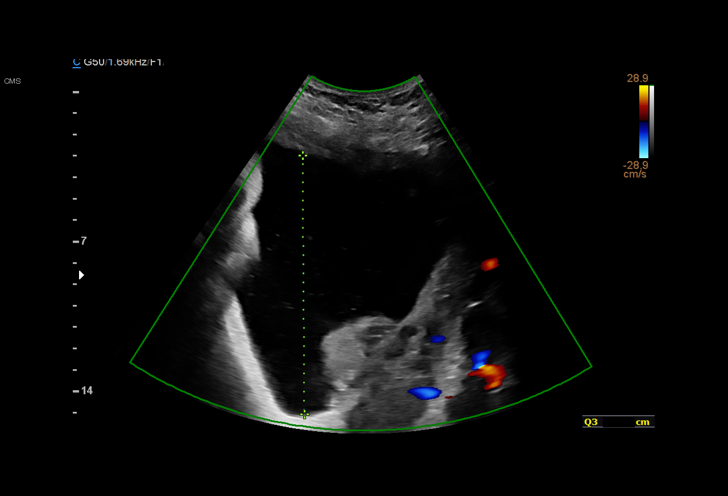
[im 23/25]
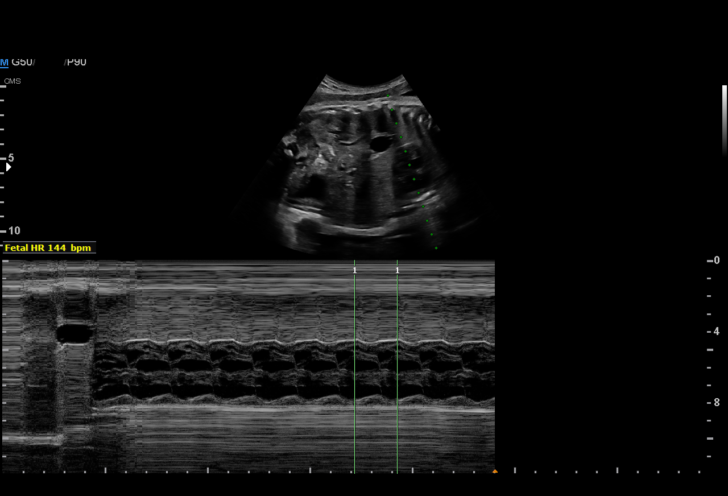
[im 25/25]
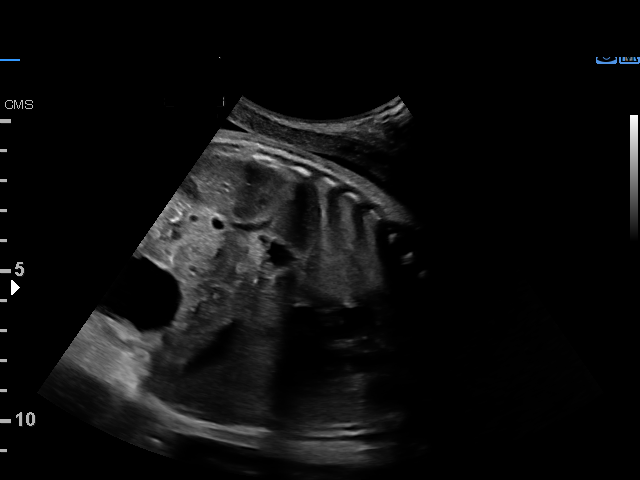

[15 of 25 positions shown; findings below may reference images not displayed]

MAU/Triage
HAKOOL NP

1  LORRAINE JIM           [id number]       [id number]     [id number]
Indications

34 weeks gestation of pregnancy
Non-reactive NST
OB History

Gravidity:    3         Term:   2        Prem:   0        SAB:   0
TOP:          0       Ectopic:  0        Living: 2
Fetal Evaluation

Num Of Fetuses:     1
Fetal Heart         144
Rate(bpm):
Cardiac Activity:   Observed
Presentation:       Cephalic

Amniotic Fluid
AFI FV:      Polyhydramnios

AFI Sum(cm)     %Tile       Largest Pocket(cm)
29.89           > 97

RUQ(cm)       RLQ(cm)       LUQ(cm)        LLQ(cm)
10.18
Biophysical Evaluation

Amniotic F.V:   Polyhydramnios             F. Tone:        Observed
F. Movement:    Observed                   Score:          [DATE]
F. Breathing:   Observed
Gestational Age

Clinical EDD:  34w 4d                                        EDD:   06/13/16
Best:          34w 4d     Det. By:  Clinical EDD             EDD:   06/13/16
Impression

IUP at 34+4 weeks with nonreactive NST, here for BPP
Amniotic fluid levels elevated at 29.9cm
BPP [DATE]
Recommendations

Would review US to look for structural anomalies that might
account for polyhydramnios as well as review gestational
diabetes testing. If failed 3hRRR but passed [id number] would
consider [id number].
Would repeat BPP in 1 week
